# Patient Record
Sex: Female | Born: 1940 | Race: White | Hispanic: No | Smoking: Never smoker
Health system: Southern US, Community
[De-identification: ages and names within clinical notes are randomized; demographics above are authoritative.]

## PROBLEM LIST (undated history)

## (undated) DIAGNOSIS — R748 Abnormal levels of other serum enzymes: Secondary | ICD-10-CM

## (undated) DIAGNOSIS — R21 Rash and other nonspecific skin eruption: Secondary | ICD-10-CM

## (undated) DIAGNOSIS — I10 Essential (primary) hypertension: Secondary | ICD-10-CM

## (undated) DIAGNOSIS — N200 Calculus of kidney: Secondary | ICD-10-CM

## (undated) DIAGNOSIS — C50919 Malignant neoplasm of unspecified site of unspecified female breast: Secondary | ICD-10-CM

## (undated) HISTORY — DX: Malignant neoplasm of unspecified site of unspecified female breast: C50.919

## (undated) HISTORY — DX: Abnormal levels of other serum enzymes: R74.8

## (undated) HISTORY — DX: Calculus of kidney: N20.0

## (undated) HISTORY — DX: Rash and other nonspecific skin eruption: R21

## (undated) HISTORY — DX: Essential (primary) hypertension: I10

## (undated) HISTORY — PX: TONSILECTOMY, ADENOIDECTOMY, BILATERAL MYRINGOTOMY AND TUBES: SHX2538

---

## 1999-03-22 ENCOUNTER — Other Ambulatory Visit: Admission: RE | Admit: 1999-03-22 | Discharge: 1999-03-22 | Payer: Self-pay | Admitting: *Deleted

## 2002-01-28 ENCOUNTER — Other Ambulatory Visit: Admission: RE | Admit: 2002-01-28 | Discharge: 2002-01-28 | Payer: Self-pay | Admitting: *Deleted

## 2002-12-02 ENCOUNTER — Encounter: Payer: Self-pay | Admitting: Urology

## 2002-12-02 ENCOUNTER — Inpatient Hospital Stay (HOSPITAL_COMMUNITY): Admission: EM | Admit: 2002-12-02 | Discharge: 2002-12-04 | Payer: Self-pay | Admitting: Emergency Medicine

## 2002-12-02 ENCOUNTER — Encounter: Payer: Self-pay | Admitting: Emergency Medicine

## 2002-12-03 ENCOUNTER — Encounter: Payer: Self-pay | Admitting: Urology

## 2002-12-22 ENCOUNTER — Observation Stay (HOSPITAL_COMMUNITY): Admission: RE | Admit: 2002-12-22 | Discharge: 2002-12-23 | Payer: Self-pay | Admitting: Urology

## 2003-04-06 ENCOUNTER — Ambulatory Visit (HOSPITAL_COMMUNITY): Admission: RE | Admit: 2003-04-06 | Discharge: 2003-04-06 | Payer: Self-pay | Admitting: Family Medicine

## 2003-04-06 ENCOUNTER — Encounter: Payer: Self-pay | Admitting: Family Medicine

## 2008-06-26 DIAGNOSIS — C50919 Malignant neoplasm of unspecified site of unspecified female breast: Secondary | ICD-10-CM

## 2008-06-26 HISTORY — DX: Malignant neoplasm of unspecified site of unspecified female breast: C50.919

## 2008-11-19 ENCOUNTER — Inpatient Hospital Stay (HOSPITAL_COMMUNITY): Admission: EM | Admit: 2008-11-19 | Discharge: 2008-11-20 | Payer: Self-pay | Admitting: Emergency Medicine

## 2008-11-19 ENCOUNTER — Ambulatory Visit: Payer: Self-pay | Admitting: Diagnostic Radiology

## 2008-11-19 ENCOUNTER — Encounter: Payer: Self-pay | Admitting: Emergency Medicine

## 2008-11-19 ENCOUNTER — Ambulatory Visit: Payer: Self-pay | Admitting: *Deleted

## 2008-12-09 ENCOUNTER — Encounter: Payer: Self-pay | Admitting: Cardiology

## 2008-12-22 ENCOUNTER — Encounter: Admission: RE | Admit: 2008-12-22 | Discharge: 2008-12-22 | Payer: Self-pay | Admitting: General Surgery

## 2009-01-12 ENCOUNTER — Encounter (INDEPENDENT_AMBULATORY_CARE_PROVIDER_SITE_OTHER): Payer: Self-pay | Admitting: General Surgery

## 2009-01-12 ENCOUNTER — Ambulatory Visit (HOSPITAL_COMMUNITY): Admission: RE | Admit: 2009-01-12 | Discharge: 2009-01-12 | Payer: Self-pay | Admitting: General Surgery

## 2009-02-04 ENCOUNTER — Ambulatory Visit: Payer: Self-pay | Admitting: Oncology

## 2009-02-12 ENCOUNTER — Ambulatory Visit: Admission: RE | Admit: 2009-02-12 | Discharge: 2009-03-25 | Payer: Self-pay | Admitting: Radiation Oncology

## 2009-02-15 LAB — CBC WITH DIFFERENTIAL/PLATELET
BASO%: 0.6 % (ref 0.0–2.0)
LYMPH%: 17.8 % (ref 14.0–49.7)
MCHC: 34.3 g/dL (ref 31.5–36.0)
MONO#: 0.5 10*3/uL (ref 0.1–0.9)
NEUT#: 6.3 10*3/uL (ref 1.5–6.5)
Platelets: 161 10*3/uL (ref 145–400)
RBC: 4.39 10*6/uL (ref 3.70–5.45)
RDW: 13.7 % (ref 11.2–14.5)
WBC: 8.4 10*3/uL (ref 3.9–10.3)
lymph#: 1.5 10*3/uL (ref 0.9–3.3)

## 2009-02-15 LAB — COMPREHENSIVE METABOLIC PANEL
ALT: 19 U/L (ref 0–35)
Albumin: 3.9 g/dL (ref 3.5–5.2)
CO2: 25 mEq/L (ref 19–32)
Potassium: 3.6 mEq/L (ref 3.5–5.3)
Sodium: 144 mEq/L (ref 135–145)
Total Bilirubin: 0.6 mg/dL (ref 0.3–1.2)
Total Protein: 6.3 g/dL (ref 6.0–8.3)

## 2009-02-26 ENCOUNTER — Ambulatory Visit (HOSPITAL_COMMUNITY): Admission: RE | Admit: 2009-02-26 | Discharge: 2009-02-26 | Payer: Self-pay | Admitting: Oncology

## 2009-02-26 LAB — CBC WITH DIFFERENTIAL/PLATELET
BASO%: 0.6 % (ref 0.0–2.0)
Basophils Absolute: 0.1 10*3/uL (ref 0.0–0.1)
HCT: 40.9 % (ref 34.8–46.6)
HGB: 14.4 g/dL (ref 11.6–15.9)
MONO#: 0.6 10*3/uL (ref 0.1–0.9)
NEUT%: 67.7 % (ref 38.4–76.8)
RDW: 13.3 % (ref 11.2–14.5)
WBC: 7.8 10*3/uL (ref 3.9–10.3)
lymph#: 1.8 10*3/uL (ref 0.9–3.3)

## 2009-03-04 LAB — URINALYSIS, MICROSCOPIC - CHCC
Bilirubin (Urine): NEGATIVE
Ketones: NEGATIVE mg/dL
Protein: <30 mg/dL
Specific Gravity, Urine: 1.02 (ref 1.003–1.035)
pH: 6 (ref 4.6–8.0)

## 2009-03-05 LAB — URINE CULTURE

## 2009-03-10 ENCOUNTER — Ambulatory Visit: Payer: Self-pay | Admitting: Oncology

## 2009-03-12 LAB — COMPREHENSIVE METABOLIC PANEL
ALT: 16 U/L (ref 0–35)
AST: 17 U/L (ref 0–37)
CO2: 28 mEq/L (ref 19–32)
Creatinine, Ser: 0.72 mg/dL (ref 0.40–1.20)
Sodium: 141 mEq/L (ref 135–145)
Total Bilirubin: 0.4 mg/dL (ref 0.3–1.2)
Total Protein: 6.3 g/dL (ref 6.0–8.3)

## 2009-03-12 LAB — CBC WITH DIFFERENTIAL/PLATELET
BASO%: 1.2 % (ref 0.0–2.0)
EOS%: 0.6 % (ref 0.0–7.0)
LYMPH%: 17.9 % (ref 14.0–49.7)
MCHC: 34.7 g/dL (ref 31.5–36.0)
MCV: 91.2 fL (ref 79.5–101.0)
MONO%: 14.3 % — ABNORMAL HIGH (ref 0.0–14.0)
Platelets: 222 10*3/uL (ref 145–400)
RBC: 3.88 10*6/uL (ref 3.70–5.45)
RDW: 13.4 % (ref 11.2–14.5)

## 2009-03-26 LAB — CBC WITH DIFFERENTIAL/PLATELET
Eosinophils Absolute: 0.1 10*3/uL (ref 0.0–0.5)
HCT: 33 % — ABNORMAL LOW (ref 34.8–46.6)
LYMPH%: 8.9 % — ABNORMAL LOW (ref 14.0–49.7)
MCHC: 35.2 g/dL (ref 31.5–36.0)
MCV: 90.7 fL (ref 79.5–101.0)
MONO#: 1.2 10*3/uL — ABNORMAL HIGH (ref 0.1–0.9)
MONO%: 12.5 % (ref 0.0–14.0)
NEUT#: 7.4 10*3/uL — ABNORMAL HIGH (ref 1.5–6.5)
NEUT%: 76.6 % (ref 38.4–76.8)
Platelets: 158 10*3/uL (ref 145–400)
RBC: 3.64 10*6/uL — ABNORMAL LOW (ref 3.70–5.45)
WBC: 9.7 10*3/uL (ref 3.9–10.3)

## 2009-03-26 LAB — COMPREHENSIVE METABOLIC PANEL
BUN: 10 mg/dL (ref 6–23)
CO2: 29 mEq/L (ref 19–32)
Calcium: 8.7 mg/dL (ref 8.4–10.5)
Chloride: 100 mEq/L (ref 96–112)
Creatinine, Ser: 0.83 mg/dL (ref 0.40–1.20)
Total Bilirubin: 0.5 mg/dL (ref 0.3–1.2)

## 2009-04-02 LAB — BASIC METABOLIC PANEL
CO2: 23 mEq/L (ref 19–32)
Chloride: 105 mEq/L (ref 96–112)
Glucose, Bld: 137 mg/dL — ABNORMAL HIGH (ref 70–99)
Potassium: 4 mEq/L (ref 3.5–5.3)
Sodium: 138 mEq/L (ref 135–145)

## 2009-04-07 ENCOUNTER — Ambulatory Visit: Payer: Self-pay | Admitting: Oncology

## 2009-04-09 LAB — COMPREHENSIVE METABOLIC PANEL
ALT: 14 U/L (ref 0–35)
AST: 21 U/L (ref 0–37)
CO2: 26 mEq/L (ref 19–32)
Chloride: 107 mEq/L (ref 96–112)
Sodium: 138 mEq/L (ref 135–145)
Total Bilirubin: 0.6 mg/dL (ref 0.3–1.2)
Total Protein: 5.4 g/dL — ABNORMAL LOW (ref 6.0–8.3)

## 2009-04-09 LAB — CBC WITH DIFFERENTIAL/PLATELET
BASO%: 1 % (ref 0.0–2.0)
Eosinophils Absolute: 0.1 10*3/uL (ref 0.0–0.5)
MONO#: 1.1 10*3/uL — ABNORMAL HIGH (ref 0.1–0.9)
MONO%: 16.2 % — ABNORMAL HIGH (ref 0.0–14.0)
NEUT#: 4.7 10*3/uL (ref 1.5–6.5)
RBC: 2.85 10*6/uL — ABNORMAL LOW (ref 3.70–5.45)
RDW: 17.1 % — ABNORMAL HIGH (ref 11.2–14.5)
nRBC: 2 % — ABNORMAL HIGH (ref 0–0)

## 2009-04-22 LAB — CBC WITH DIFFERENTIAL/PLATELET
BASO%: 0.6 % (ref 0.0–2.0)
EOS%: 0.2 % (ref 0.0–7.0)
LYMPH%: 8.3 % — ABNORMAL LOW (ref 14.0–49.7)
MCH: 34.2 pg — ABNORMAL HIGH (ref 25.1–34.0)
MCHC: 34.3 g/dL (ref 31.5–36.0)
MONO#: 1 10*3/uL — ABNORMAL HIGH (ref 0.1–0.9)
MONO%: 15.5 % — ABNORMAL HIGH (ref 0.0–14.0)
NEUT%: 75.4 % (ref 38.4–76.8)
Platelets: 132 10*3/uL — ABNORMAL LOW (ref 145–400)
RBC: 2.65 10*6/uL — ABNORMAL LOW (ref 3.70–5.45)
WBC: 6.5 10*3/uL (ref 3.9–10.3)

## 2009-04-22 LAB — COMPREHENSIVE METABOLIC PANEL
BUN: 10 mg/dL (ref 6–23)
CO2: 23 mEq/L (ref 19–32)
Calcium: 8.7 mg/dL (ref 8.4–10.5)
Chloride: 103 mEq/L (ref 96–112)
Creatinine, Ser: 0.64 mg/dL (ref 0.40–1.20)
Glucose, Bld: 107 mg/dL — ABNORMAL HIGH (ref 70–99)

## 2009-04-30 LAB — CBC WITH DIFFERENTIAL/PLATELET
BASO%: 0.2 % (ref 0.0–2.0)
Basophils Absolute: 0 10*3/uL (ref 0.0–0.1)
EOS%: 0 % (ref 0.0–7.0)
HGB: 8.4 g/dL — ABNORMAL LOW (ref 11.6–15.9)
MCH: 34.5 pg — ABNORMAL HIGH (ref 25.1–34.0)
MCV: 99.8 fL (ref 79.5–101.0)
MONO%: 10.4 % (ref 0.0–14.0)
RBC: 2.43 10*6/uL — ABNORMAL LOW (ref 3.70–5.45)
RDW: 21.9 % — ABNORMAL HIGH (ref 11.2–14.5)
lymph#: 0.7 10*3/uL — ABNORMAL LOW (ref 0.9–3.3)

## 2009-04-30 LAB — COMPREHENSIVE METABOLIC PANEL
ALT: 27 U/L (ref 0–35)
AST: 40 U/L — ABNORMAL HIGH (ref 0–37)
Albumin: 3.2 g/dL — ABNORMAL LOW (ref 3.5–5.2)
Alkaline Phosphatase: 129 U/L — ABNORMAL HIGH (ref 39–117)
BUN: 5 mg/dL — ABNORMAL LOW (ref 6–23)
Calcium: 8.8 mg/dL (ref 8.4–10.5)
Chloride: 102 mEq/L (ref 96–112)
Potassium: 3.1 mEq/L — ABNORMAL LOW (ref 3.5–5.3)
Sodium: 136 mEq/L (ref 135–145)
Total Protein: 5.9 g/dL — ABNORMAL LOW (ref 6.0–8.3)

## 2009-04-30 LAB — URINALYSIS, MICROSCOPIC - CHCC
Ketones: NEGATIVE mg/dL
Nitrite: NEGATIVE
Protein: 100 mg/dL
pH: 6 (ref 4.6–8.0)

## 2009-05-05 ENCOUNTER — Ambulatory Visit: Payer: Self-pay | Admitting: Oncology

## 2009-05-07 LAB — CBC WITH DIFFERENTIAL/PLATELET
BASO%: 1.2 % (ref 0.0–2.0)
Basophils Absolute: 0.1 10*3/uL (ref 0.0–0.1)
EOS%: 0.1 % (ref 0.0–7.0)
Eosinophils Absolute: 0 10*3/uL (ref 0.0–0.5)
HCT: 30 % — ABNORMAL LOW (ref 34.8–46.6)
HGB: 9.8 g/dL — ABNORMAL LOW (ref 11.6–15.9)
LYMPH%: 10.4 % — ABNORMAL LOW (ref 14.0–49.7)
MCH: 33.3 pg (ref 25.1–34.0)
MCHC: 32.7 g/dL (ref 31.5–36.0)
MCV: 102 fL — ABNORMAL HIGH (ref 79.5–101.0)
MONO#: 0.9 10*3/uL (ref 0.1–0.9)
MONO%: 9.9 % (ref 0.0–14.0)
NEUT#: 7.2 10*3/uL — ABNORMAL HIGH (ref 1.5–6.5)
NEUT%: 78.4 % — ABNORMAL HIGH (ref 38.4–76.8)
Platelets: 144 10*3/uL — ABNORMAL LOW (ref 145–400)
RBC: 2.94 10*6/uL — ABNORMAL LOW (ref 3.70–5.45)
RDW: 21.8 % — ABNORMAL HIGH (ref 11.2–14.5)
WBC: 9.2 10*3/uL (ref 3.9–10.3)
lymph#: 1 10*3/uL (ref 0.9–3.3)

## 2009-05-07 LAB — COMPREHENSIVE METABOLIC PANEL
AST: 27 U/L (ref 0–37)
Albumin: 3.9 g/dL (ref 3.5–5.2)
Alkaline Phosphatase: 102 U/L (ref 39–117)
BUN: 10 mg/dL (ref 6–23)
Calcium: 8.3 mg/dL — ABNORMAL LOW (ref 8.4–10.5)
Chloride: 104 mEq/L (ref 96–112)
Potassium: 3.3 mEq/L — ABNORMAL LOW (ref 3.5–5.3)
Sodium: 142 mEq/L (ref 135–145)
Total Protein: 5.7 g/dL — ABNORMAL LOW (ref 6.0–8.3)

## 2009-05-21 LAB — CBC WITH DIFFERENTIAL/PLATELET
BASO%: 0.4 % (ref 0.0–2.0)
Basophils Absolute: 0 10*3/uL (ref 0.0–0.1)
EOS%: 0.8 % (ref 0.0–7.0)
HGB: 9.7 g/dL — ABNORMAL LOW (ref 11.6–15.9)
MCH: 33.7 pg (ref 25.1–34.0)
MCHC: 32.3 g/dL (ref 31.5–36.0)
MCV: 104.2 fL — ABNORMAL HIGH (ref 79.5–101.0)
MONO%: 6.8 % (ref 0.0–14.0)
RBC: 2.88 10*6/uL — ABNORMAL LOW (ref 3.70–5.45)
RDW: 18.7 % — ABNORMAL HIGH (ref 11.2–14.5)
lymph#: 0.8 10*3/uL — ABNORMAL LOW (ref 0.9–3.3)

## 2009-05-21 LAB — COMPREHENSIVE METABOLIC PANEL
Albumin: 3.8 g/dL (ref 3.5–5.2)
Alkaline Phosphatase: 96 U/L (ref 39–117)
CO2: 25 mEq/L (ref 19–32)
Glucose, Bld: 116 mg/dL — ABNORMAL HIGH (ref 70–99)
Potassium: 3.6 mEq/L (ref 3.5–5.3)
Sodium: 140 mEq/L (ref 135–145)
Total Protein: 5.6 g/dL — ABNORMAL LOW (ref 6.0–8.3)

## 2009-05-21 LAB — URINALYSIS, MICROSCOPIC - CHCC
Bilirubin (Urine): NEGATIVE
Glucose: NEGATIVE g/dL
Ketones: NEGATIVE mg/dL
Nitrite: NEGATIVE

## 2009-06-01 ENCOUNTER — Ambulatory Visit: Admission: RE | Admit: 2009-06-01 | Discharge: 2009-06-25 | Payer: Self-pay | Admitting: Radiation Oncology

## 2009-06-04 ENCOUNTER — Ambulatory Visit: Payer: Self-pay | Admitting: Oncology

## 2009-06-04 LAB — CBC WITH DIFFERENTIAL/PLATELET
BASO%: 0.5 % (ref 0.0–2.0)
HCT: 30 % — ABNORMAL LOW (ref 34.8–46.6)
MCHC: 32.3 g/dL (ref 31.5–36.0)
MONO#: 0.4 10*3/uL (ref 0.1–0.9)
RBC: 2.88 10*6/uL — ABNORMAL LOW (ref 3.70–5.45)
RDW: 17 % — ABNORMAL HIGH (ref 11.2–14.5)
WBC: 6.1 10*3/uL (ref 3.9–10.3)
lymph#: 0.7 10*3/uL — ABNORMAL LOW (ref 0.9–3.3)
nRBC: 0 % (ref 0–0)

## 2009-06-04 LAB — COMPREHENSIVE METABOLIC PANEL
ALT: 19 U/L (ref 0–35)
AST: 22 U/L (ref 0–37)
Albumin: 3.3 g/dL — ABNORMAL LOW (ref 3.5–5.2)
Alkaline Phosphatase: 100 U/L (ref 39–117)
Calcium: 8.7 mg/dL (ref 8.4–10.5)
Chloride: 106 mEq/L (ref 96–112)
Potassium: 3.9 mEq/L (ref 3.5–5.3)
Sodium: 139 mEq/L (ref 135–145)

## 2009-06-21 LAB — CBC WITH DIFFERENTIAL/PLATELET
BASO%: 0.4 % (ref 0.0–2.0)
EOS%: 0.5 % (ref 0.0–7.0)
HCT: 33 % — ABNORMAL LOW (ref 34.8–46.6)
MCH: 35.2 pg — ABNORMAL HIGH (ref 25.1–34.0)
MCHC: 33.8 g/dL (ref 31.5–36.0)
MCV: 104.3 fL — ABNORMAL HIGH (ref 79.5–101.0)
MONO%: 10.5 % (ref 0.0–14.0)
NEUT%: 72.2 % (ref 38.4–76.8)
RDW: 16.9 % — ABNORMAL HIGH (ref 11.2–14.5)
lymph#: 0.9 10*3/uL (ref 0.9–3.3)

## 2009-06-23 ENCOUNTER — Ambulatory Visit (HOSPITAL_COMMUNITY): Admission: RE | Admit: 2009-06-23 | Discharge: 2009-06-23 | Payer: Self-pay | Admitting: Oncology

## 2009-06-27 ENCOUNTER — Ambulatory Visit: Admission: RE | Admit: 2009-06-27 | Discharge: 2009-08-25 | Payer: Self-pay | Admitting: Radiation Oncology

## 2009-07-20 ENCOUNTER — Ambulatory Visit: Payer: Self-pay | Admitting: Oncology

## 2009-07-23 LAB — COMPREHENSIVE METABOLIC PANEL
AST: 19 U/L (ref 0–37)
Albumin: 4.1 g/dL (ref 3.5–5.2)
BUN: 14 mg/dL (ref 6–23)
CO2: 24 mEq/L (ref 19–32)
Calcium: 9 mg/dL (ref 8.4–10.5)
Chloride: 107 mEq/L (ref 96–112)
Creatinine, Ser: 0.71 mg/dL (ref 0.40–1.20)
Glucose, Bld: 128 mg/dL — ABNORMAL HIGH (ref 70–99)
Potassium: 3.8 mEq/L (ref 3.5–5.3)

## 2009-07-23 LAB — CBC WITH DIFFERENTIAL/PLATELET
Basophils Absolute: 0 10*3/uL (ref 0.0–0.1)
EOS%: 3 % (ref 0.0–7.0)
Eosinophils Absolute: 0.1 10*3/uL (ref 0.0–0.5)
HCT: 39.2 % (ref 34.8–46.6)
HGB: 13.4 g/dL (ref 11.6–15.9)
MCH: 33.5 pg (ref 25.1–34.0)
NEUT#: 3 10*3/uL (ref 1.5–6.5)
NEUT%: 71.3 % (ref 38.4–76.8)
RDW: 13.5 % (ref 11.2–14.5)
lymph#: 0.7 10*3/uL — ABNORMAL LOW (ref 0.9–3.3)

## 2009-08-31 ENCOUNTER — Ambulatory Visit: Payer: Self-pay | Admitting: Oncology

## 2009-09-03 LAB — CBC WITH DIFFERENTIAL/PLATELET
BASO%: 0.6 % (ref 0.0–2.0)
EOS%: 1.5 % (ref 0.0–7.0)
MCH: 33.3 pg (ref 25.1–34.0)
MCHC: 35.2 g/dL (ref 31.5–36.0)
NEUT%: 70.8 % (ref 38.4–76.8)
RBC: 3.92 10*6/uL (ref 3.70–5.45)
RDW: 13 % (ref 11.2–14.5)
lymph#: 0.8 10*3/uL — ABNORMAL LOW (ref 0.9–3.3)

## 2009-09-03 LAB — COMPREHENSIVE METABOLIC PANEL
ALT: 16 U/L (ref 0–35)
AST: 15 U/L (ref 0–37)
Calcium: 9.1 mg/dL (ref 8.4–10.5)
Chloride: 107 mEq/L (ref 96–112)
Creatinine, Ser: 0.76 mg/dL (ref 0.40–1.20)
Potassium: 3.9 mEq/L (ref 3.5–5.3)
Sodium: 141 mEq/L (ref 135–145)

## 2009-12-07 ENCOUNTER — Other Ambulatory Visit: Admission: RE | Admit: 2009-12-07 | Discharge: 2009-12-07 | Payer: Self-pay | Admitting: Family Medicine

## 2009-12-20 ENCOUNTER — Ambulatory Visit: Payer: Self-pay | Admitting: Oncology

## 2009-12-22 LAB — COMPREHENSIVE METABOLIC PANEL
ALT: 15 U/L (ref 0–35)
AST: 18 U/L (ref 0–37)
Albumin: 4.1 g/dL (ref 3.5–5.2)
CO2: 22 mEq/L (ref 19–32)
Calcium: 9.5 mg/dL (ref 8.4–10.5)
Chloride: 108 mEq/L (ref 96–112)
Potassium: 4.5 mEq/L (ref 3.5–5.3)
Total Protein: 6.5 g/dL (ref 6.0–8.3)

## 2009-12-22 LAB — CBC WITH DIFFERENTIAL/PLATELET
BASO%: 0.7 % (ref 0.0–2.0)
EOS%: 1.7 % (ref 0.0–7.0)
HGB: 13.3 g/dL (ref 11.6–15.9)
MCH: 33.4 pg (ref 25.1–34.0)
MCHC: 34.4 g/dL (ref 31.5–36.0)
MONO#: 0.6 10*3/uL (ref 0.1–0.9)
RDW: 13.9 % (ref 11.2–14.5)
WBC: 5.5 10*3/uL (ref 3.9–10.3)
lymph#: 1 10*3/uL (ref 0.9–3.3)

## 2010-03-22 IMAGING — RF IR FLUORO GUIDE CV LINE*R*
1 series · 1 of 1 positions shown · non-contrast
Comparison: none

CLINICAL DATA: 67-year-old with breast cancer.

[Series 1: run · 1 of 1 slices shown]
[im 1/1]
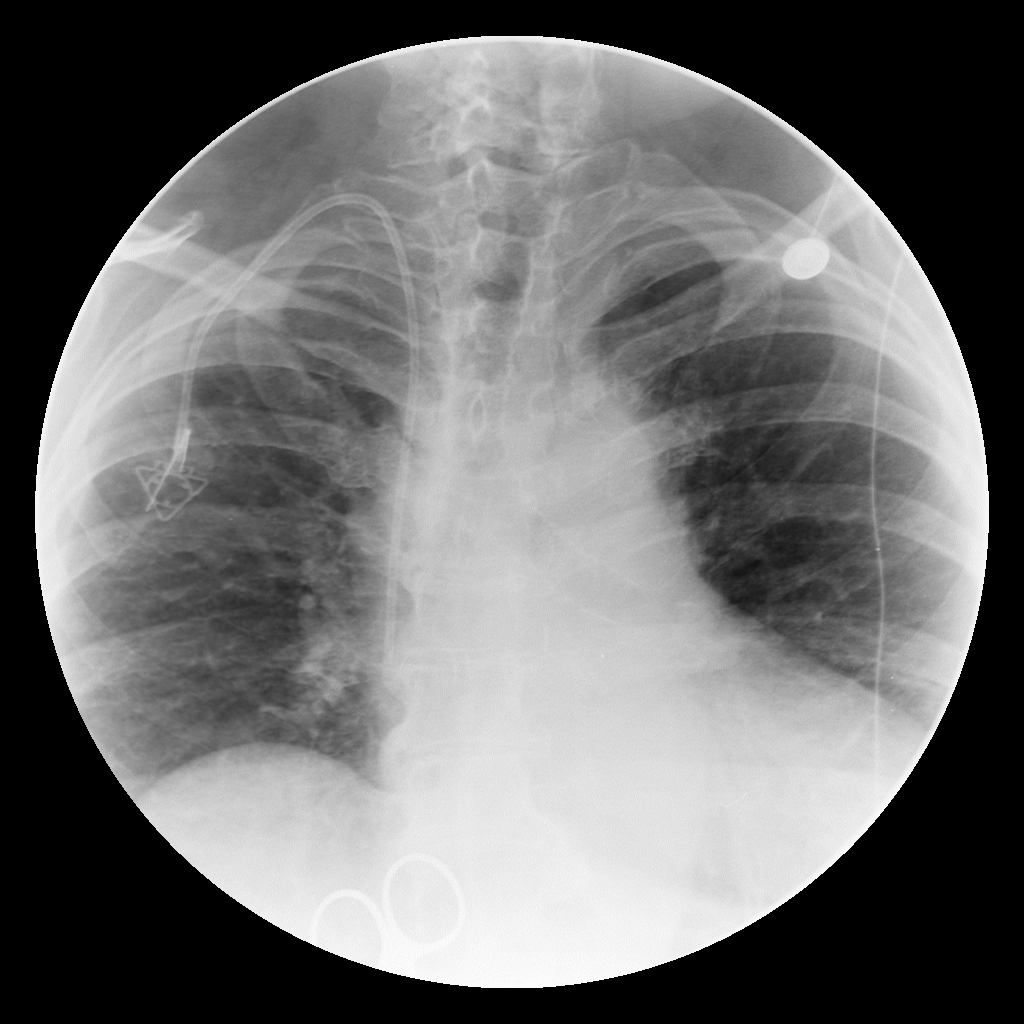

[1 of 1 positions shown; findings below may reference images not displayed]

FLUOROSCOPIC AND ULTRASOUND GUIDED PLACEMENT OF A SUBCUTANEOUS
PORT.

Medications:Ancef 1 gm was given in an appropriate time frame for
this procedure. Zofran 4 mg, Versed 4 mg and Fentanyl 100 mcg

Moderate sedation time:50 minutes. A radiology nurse monitored the
patient for moderate sedation.

Fluoroscopy time: 0.4 minutes

Procedure:  The risks of the procedure were explained to the
patient.  Informed consent was obtained.  Patient was placed supine
on the interventional table.  Ultrasound confirmed a patent right
internal jugular vein.  The right chest and neck were cleaned with
a skin antiseptic and a sterile drape was placed.  Maximal barrier
sterile technique was utilized including caps, mask, sterile gowns,
sterile gloves, sterile drape, hand hygiene and skin antiseptic.
The right neck was anesthetized with 1% lidocaine.  Small incision
was made in the right neck with a blade.  Micropuncture set was
placed in the right IJ with ultrasound guidance.  The micropuncture
wire was used for measurement purposes.  The right chest was
anesthetized with 1% lidocaine with epinephrine.  #15 blade was
used to make an incision and a subcutaneous port pocket was formed.
8 french Power Port was assembled.  Subcutaneous tunnel was formed
with a stiff tunneling device.  The port catheter was brought
through the subcutaneous tunnel.  The port was secured to the chest
wall.  The micropuncture set was exchanged for a peel-away sheath.
The catheter was placed through the peel-away sheath and the tip
was positioned at the cavoatrial junction.  Catheter placement was
confirmed with fluoroscopy.  The port was accessed and flushed with
heparinized saline.  The port pocket was closed using two layers of
absorbable sutures and Dermabond.  The vein dermatomy site was
closed using a single layer of absorbable suture and Dermabond.
Sterile dressings were applied.  Patient tolerated the procedure
well without an immediate complication.  Ultrasound and
fluoroscopic images were taken and saved for this procedure.
IMPRESSION: Placement of a subcutaneous port device.  The tip of
the catheter is at the cavoatrial junction and ready to be used.

## 2010-03-29 ENCOUNTER — Ambulatory Visit: Payer: Self-pay | Admitting: Oncology

## 2010-04-21 LAB — COMPREHENSIVE METABOLIC PANEL
ALT: 17 U/L (ref 0–35)
AST: 17 U/L (ref 0–37)
Albumin: 4 g/dL (ref 3.5–5.2)
BUN: 12 mg/dL (ref 6–23)
CO2: 28 mEq/L (ref 19–32)
Calcium: 9.2 mg/dL (ref 8.4–10.5)
Chloride: 103 mEq/L (ref 96–112)
Creatinine, Ser: 0.78 mg/dL (ref 0.40–1.20)
Potassium: 4.4 mEq/L (ref 3.5–5.3)

## 2010-04-21 LAB — CBC WITH DIFFERENTIAL/PLATELET
Basophils Absolute: 0 10*3/uL (ref 0.0–0.1)
EOS%: 1 % (ref 0.0–7.0)
Eosinophils Absolute: 0.1 10*3/uL (ref 0.0–0.5)
HCT: 40 % (ref 34.8–46.6)
HGB: 13.5 g/dL (ref 11.6–15.9)
MCH: 33 pg (ref 25.1–34.0)
MONO#: 0.4 10*3/uL (ref 0.1–0.9)
NEUT#: 4 10*3/uL (ref 1.5–6.5)
NEUT%: 74.4 % (ref 38.4–76.8)
RDW: 13 % (ref 11.2–14.5)
WBC: 5.4 10*3/uL (ref 3.9–10.3)
lymph#: 0.9 10*3/uL (ref 0.9–3.3)

## 2010-07-17 ENCOUNTER — Encounter: Payer: Self-pay | Admitting: Oncology

## 2010-08-18 ENCOUNTER — Encounter (HOSPITAL_BASED_OUTPATIENT_CLINIC_OR_DEPARTMENT_OTHER): Payer: PRIVATE HEALTH INSURANCE | Admitting: Oncology

## 2010-08-18 ENCOUNTER — Other Ambulatory Visit: Payer: Self-pay | Admitting: Oncology

## 2010-08-18 DIAGNOSIS — C50219 Malignant neoplasm of upper-inner quadrant of unspecified female breast: Secondary | ICD-10-CM

## 2010-08-18 DIAGNOSIS — R209 Unspecified disturbances of skin sensation: Secondary | ICD-10-CM

## 2010-08-18 DIAGNOSIS — R609 Edema, unspecified: Secondary | ICD-10-CM

## 2010-08-18 DIAGNOSIS — I10 Essential (primary) hypertension: Secondary | ICD-10-CM

## 2010-08-18 LAB — CBC WITH DIFFERENTIAL/PLATELET
BASO%: 0.5 % (ref 0.0–2.0)
EOS%: 1.1 % (ref 0.0–7.0)
HCT: 38.9 % (ref 34.8–46.6)
LYMPH%: 17.9 % (ref 14.0–49.7)
MCH: 33.6 pg (ref 25.1–34.0)
MCHC: 34.9 g/dL (ref 31.5–36.0)
NEUT%: 71.3 % (ref 38.4–76.8)
lymph#: 1 10*3/uL (ref 0.9–3.3)

## 2010-08-18 LAB — COMPREHENSIVE METABOLIC PANEL
ALT: 15 U/L (ref 0–35)
AST: 18 U/L (ref 0–37)
Alkaline Phosphatase: 112 U/L (ref 39–117)
Creatinine, Ser: 0.85 mg/dL (ref 0.40–1.20)
Total Bilirubin: 0.7 mg/dL (ref 0.3–1.2)

## 2010-10-02 LAB — COMPREHENSIVE METABOLIC PANEL
AST: 23 U/L (ref 0–37)
CO2: 32 mEq/L (ref 19–32)
Calcium: 9.2 mg/dL (ref 8.4–10.5)
Creatinine, Ser: 0.89 mg/dL (ref 0.4–1.2)
GFR calc Af Amer: >60 mL/min (ref >60)
GFR calc non Af Amer: >60 mL/min (ref >60)
Glucose, Bld: 102 mg/dL — ABNORMAL HIGH (ref 70–99)

## 2010-10-02 LAB — DIFFERENTIAL
Lymphocytes Relative: 27 % (ref 12–46)
Lymphs Abs: 2.3 10*3/uL (ref 0.7–4.0)
Neutro Abs: 5.4 10*3/uL (ref 1.7–7.7)
Neutrophils Relative %: 64 % (ref 43–77)

## 2010-10-02 LAB — CBC
MCHC: 34.9 g/dL (ref 30.0–36.0)
MCV: 93.4 fL (ref 78.0–100.0)
RBC: 4.43 MIL/uL (ref 3.87–5.11)

## 2010-10-04 LAB — COMPREHENSIVE METABOLIC PANEL
ALT: 18 U/L (ref 0–35)
AST: 23 U/L (ref 0–37)
BUN: 10 mg/dL (ref 6–23)
CO2: 26 mEq/L (ref 19–32)
Chloride: 103 mEq/L (ref 96–112)
Creatinine, Ser: 0.81 mg/dL (ref 0.4–1.2)
Glucose, Bld: 106 mg/dL — ABNORMAL HIGH (ref 70–99)
Potassium: 3.7 mEq/L (ref 3.5–5.1)

## 2010-10-04 LAB — LIPID PANEL
HDL: 36 mg/dL — ABNORMAL LOW (ref >39)
LDL Cholesterol: 92 mg/dL (ref 0–99)
Total CHOL/HDL Ratio: 4.2 RATIO
Triglycerides: 113 mg/dL (ref <150)
VLDL: 23 mg/dL (ref 0–40)

## 2010-10-04 LAB — URINALYSIS, ROUTINE W REFLEX MICROSCOPIC
Glucose, UA: NEGATIVE mg/dL
Nitrite: NEGATIVE
Protein, ur: NEGATIVE mg/dL
pH: 5.5 (ref 5.0–8.0)

## 2010-10-04 LAB — CARDIAC PANEL(CRET KIN+CKTOT+MB+TROPI)
CK, MB: 1.9 ng/mL (ref 0.3–4.0)
Troponin I: 0.01 ng/mL (ref 0.00–0.06)
Troponin I: 0.01 ng/mL (ref 0.00–0.06)

## 2010-10-04 LAB — DIFFERENTIAL
Basophils Relative: 1 % (ref 0–1)
Eosinophils Absolute: 0.2 10*3/uL (ref 0.0–0.7)
Lymphs Abs: 2.5 10*3/uL (ref 0.7–4.0)
Monocytes Absolute: 0.8 10*3/uL (ref 0.1–1.0)
Monocytes Relative: 9 % (ref 3–12)

## 2010-10-04 LAB — POCT CARDIAC MARKERS
CKMB, poc: <1 ng/mL — ABNORMAL LOW (ref 1.0–8.0)
Troponin i, poc: <0.05 ng/mL (ref 0.00–0.09)
Troponin i, poc: <0.05 ng/mL (ref 0.00–0.09)

## 2010-10-04 LAB — CBC
HCT: 38.3 % (ref 36.0–46.0)
Hemoglobin: 13.1 g/dL (ref 12.0–15.0)
Hemoglobin: 14.1 g/dL (ref 12.0–15.0)
MCHC: 33.2 g/dL (ref 30.0–36.0)
MCV: 93.5 fL (ref 78.0–100.0)
RBC: 4.05 MIL/uL (ref 3.87–5.11)
RBC: 4.54 MIL/uL (ref 3.87–5.11)
RDW: 13.1 % (ref 11.5–15.5)

## 2010-10-04 LAB — BASIC METABOLIC PANEL
CO2: 32 mEq/L (ref 19–32)
Chloride: 105 mEq/L (ref 96–112)
GFR calc Af Amer: >60 mL/min (ref >60)
Potassium: 4.1 mEq/L (ref 3.5–5.1)
Sodium: 146 mEq/L — ABNORMAL HIGH (ref 135–145)

## 2010-10-04 LAB — PROTIME-INR: INR: 1 (ref 0.00–1.49)

## 2010-10-04 LAB — POCT B-TYPE NATRIURETIC PEPTIDE (BNP): B Natriuretic Peptide, POC: 18.2 pg/mL (ref 0–100)

## 2010-11-08 NOTE — Op Note (Signed)
Lindsey Brady, Lindsey Brady NO.:  [id number]   MEDICAL RECORD NO.:  [id number]           PATIENT TYPE:   LOCATION:                                 FACILITY:   PHYSICIAN:  [name]. Vernell Morgans, M.D. DATE OF BIRTH:  1940-09-28   DATE OF PROCEDURE:  01/12/2009  DATE OF DISCHARGE:                               OPERATIVE REPORT   PREOPERATIVE DIAGNOSIS:  Left breast cancer.   POSTOPERATIVE DIAGNOSIS:  Left breast cancer.   PROCEDURE:  Left breast needle-localized lumpectomy and sentinel node  biopsy x3 with injection of blue dye.   SURGEON:  [name]. Vernell Morgans, MD   ANESTHESIA:  General endotracheal.   PROCEDURE:  After informed consent was obtained, the patient was brought  to the operating room, placed in supine position on the operating table.  After adequate induction of general anesthesia, the patient's left  breast, chest, and axillary area were all prepped with Betadine and  draped in usual sterile manner.  Earlier in the day, the patient had  undergone a wire localization procedure and the wire was entering the  left breast in the 12 o'clock position just at the edge of the areola  and headed medially.  The patient also had undergone injection of 1 mCi  of technetium sulfur colloid in the subareolar position.  At this point,  2 mL of methylene blue and 3 mL of injectable saline were also injected  in the subareolar position and the breast was massaged for about 5  minutes.  Next, NeoProbe was used to identify a hot spot in the left  axilla.  A small transverse incision was made overlying the hot spot.  This incision was carried down through the skin and subcutaneous tissue  sharply with electrocautery until the axilla was entered.  Then, a liver  retractor was deployed using the NeoProbe to guide the dissection.  Three hot lymph nodes were identified.  They all had a small amount of  blue dye in them.  Each of these was excised by a combination of sharp  Bovie  dissection and then the lymphatics were clamped with hemostats,  divided and ligated 3-0 Vicryl ties.  Ex vivo counts on these 3 lymph  nodes ranged between 50 and 100 counts.  These were all sent for touch  preps and touch preps on these nodes were negative for any cancer cells.  Hemostasis was also achieved using the Bovie electrocautery.  The deep  layer of the wound was then closed with interrupted 3-0 Vicryl stitches,  the skin was closed with running 4-0 Monocryl subcuticular stitch.  Next, attention was turned to the left breast.  A curvilinear incision  was made transversely medial to the wire entry site with a 15-blade  knife.  This incision was carried down through the skin and subcutaneous  tissue sharply with the electrocautery.  Once into the breast tissue,  the path of wire could be palpated.  A circular portion of breast tissue  was excised sharply around the path of the wire.  This was all done  sharply with the electrocautery.  Once this specimen was removed, it was  oriented according to the assigned paint colors.  Specimen radiograph  showed the clip to be in the center of the specimen.  It was then sent  to pathology for further evaluation.  Hemostasis was achieved using the  Bovie electrocautery.  The wound was irrigated copious amounts of saline  and then infiltrated with 0.25% Marcaine.  The deeper layer of the wound  was then closed with interrupted 3-0 Vicryl stitches and skin was closed  with running 4-0 Monocryl subcuticular stitch.  Dermabond dressing was applied to each incision.  The patient tolerated  the procedure well.  At the end of the case, all needle, sponge, and  instrument counts were correct.  The patient was then awakened, taken to  recovery room in stable condition.      [name]. Vernell Morgans, M.D.  Electronically Signed     PST/MEDQ  D:  01/12/2009  T:  01/13/2009  Job:  [id number]

## 2010-11-08 NOTE — H&P (Signed)
Lindsey Brady, Lindsey Brady NO.:  [id number]   MEDICAL RECORD NO.:  [id number]          PATIENT TYPE:  INP   LOCATION:  4733                         FACILITY:  [facility]   PHYSICIAN:  Audery Amel, MD    DATE OF BIRTH:  [birth year]   DATE OF ADMISSION:  11/19/2008  DATE OF DISCHARGE:                              HISTORY & PHYSICAL   CHIEF COMPLAINT:  Chest pain.   HISTORY OF THE PRESENT ILLNESS:  The patient is a 70 year old white  female with no prior history of coronary disease who is transferred from  the [facility] emergency department for further evaluation of chest  pain.  The patient states her symptoms began at approximately 4:00 P.M.  while at work this afternoon.  She was resting at the time of onset and  the symptoms lasted approximately 1.5 hours, and then resolved  spontaneously.  She states that she had a similar episode in the past  for which she underwent evaluation with a nuclear stress test, which was  interpreted as negative per her report approximately 3-4 years ago.  In  he [facility] ED an EKG was obtained, which revealed no evidence of  acute injury or ischemia.  Her cardiac biomarkers were negative by point  of care x2.   On arrival to [facility] the patient was chest pain-free.  On review of  her records the patient presented with uncontrolled hypertension with a  systolic blood pressure of 200.  On arrival [facility] to [facility] her  systolic was 190 over a diastolic 111.  She remains chest pain-free, and  otherwise is without complaints.   PAST MEDICAL HISTORY:  1. Hypertension.  2. Osteoarthritis.   MEDICATIONS:  Current home medications are none.   ALLERGIES:  No known drug allergies.   SOCIAL HISTORY:  She denies any smoking, alcohol or illicit substances.   FAMILY HISTORY:  There is no premature history of coronary artery  disease in her family.  Her father had coronary angioplasty performed in  his 39s.   REVIEW OF SYSTEMS:  The  review of systems is as per the HPI; otherwise,  a complete review of systems obtained at this time and is negative,  except as documented.  Of note, the patient states that she feels that  her functional capacity over the last several years has gradually  declined.  She does report dyspnea with heavy exertion, but states that  she is able to perform 4-6 minutes without any difficulties.   PHYSICAL EXAMINATION:  VITAL SIGNS:  Blood pressure is 190/111, pulse is  80 beats/minute and O2 sat is 96 on room air.  GENERAL APPEARANCE:  In general the patient is alert and oriented x3,  and in no acute stress and is a pleasant person.  HEENT:  Normocephalic and atraumatic head.  EOMI.  Nares patent.  Oral  mucosa, pharynx are clear without erythema or exudate.  NECK:  The patient's neck is supple with full range of motion and with  no appreciable JVD.  There is no palpable thyromegaly or  lymphadenopathy.  Carotid upstrokes are equal and symmetric bilaterally  with no audible bruits.  CHEST:  The patient's chest is clear to auscultation bilaterally.  HEART:  Cardiovascular reveals a normal S1 and S2 with a positive S4.  Her PMI is nonpalpable.  Peripheral pulses are 2+ and symmetric  bilaterally.  ABDOMEN:  The abdomen is obese, soft, nontender and nondistended.  Positive bowel sounds.  No hepatosplenomegaly.  EXTREMITIES:  The extremities reveal 2+ bilateral lower extremity edema.  There is no evidence of inflammation or ulceration.  NEUROLOGIC  EXAMINATION:  Neurologically she is grossly nonfocal.   LABORATORY DATA:  Ancillary data:  EKG by my interpretation revealed  normal sinus rhythm with left axis deviation and LVH by voltage criteria  and aVL.   Labs:  Sodium 146, potassium 4.1, chloride 105, CO2 32, BUN 15,  creatinine 0.8, and glucose 98.  White count 8.4, hemoglobin 14.1,  hematocrit 42.5 and platelet count 194,000.  BNP 18.2.  Cardiac  biomarkers by point care testing were  negative x2.   IMPRESSION:  1. Chest pain rule out myocardial infarction.  2. Uncontrolled hypertension.  3. Obesity.   PLAN:  1. We will admit the patient to a telemetry bed for further evaluation      and monitoring.  2. We will cycle cardiac biomarkers x2 additional sets to rule out      myocardial injury.  Her symptoms seem somewhat unusual for that of      a cardiac etiology, especially given that her symptoms started      approximately 4:00 P.M.  The EKG revealed no evidence of acute      injury or ischemia, and her cardiac biomarkers were negative x2      sets by point of care testing.  3. The patient, however, does have severe hypertension, which is      uncontrolled.  The patient states that she was previously      prescribed blood pressure medication several years ago; however, it      made her feel fatigued and therefore she self-discontinued the      medication.  4. If the patient rules out myocardial infarction, I do believe that a      noninvasive evaluation would certainly be a reasonable next step      for further risk stratification.  However, I do believe that the      patient needs better blood pressure control prior to undergoing      stress testing.  I have explained the importance of maintaining      normal blood pressure for preventative purposes.  I have initiated      metoprolol 25 mg by mouth twice a day as well as an ACE inhibitor      and lisinopril 20 mg daily.  Given that the patient had significant      difficulty with an antihypertensive in the past, these should be      titrated slowly to achieve the desired effect.  5. We will check her lipid profile in the A.M. and consider statin      therapy if indicated.      Audery Amel, MD  Electronically Signed     SHG/MEDQ  D:  11/20/2008  T:  11/20/2008  Job:  [id number]

## 2010-11-08 NOTE — Discharge Summary (Signed)
Lindsey Brady, Lindsey Brady                  ACCOUNT NO.:  [id number]   MEDICAL RECORD NO.:  [id number]          PATIENT TYPE:  INP   LOCATION:  4733                         FACILITY:  [facility]   PHYSICIAN:  Armanda Magic, M.D.     DATE OF BIRTH:  Feb 02, 1941   DATE OF ADMISSION:  11/19/2008  DATE OF DISCHARGE:  11/20/2008                               DISCHARGE SUMMARY   DISCHARGE DIAGNOSES:  1. Atypical chest pain, negative enzymes.  2. Hypertension.  3. Obesity.   HOSPITAL COURSE:  Lindsey Brady is a 70 year old female with no prior  history of coronary artery disease, who began having chest pain symptoms  around 4:00 p.m. on the day of admission.  The pain lasted about an hour  and a half and then spontaneously resolved.  She has had similar  episodes in the past.  About 3 or 4 years ago, she had a stress  Cardiolite, which was normal according to the patient.   The patient was watched in the hospital overnight, and troponins were  negative.  We also did a chest CT to rule out pulmonary embolism, and  this was negative.  There was a 10-mm left breast nodule and the  mammography was recommended, and this will be done as an outpatient.   OTHER LABORATORY STUDIES DURING THE PATIENT'S HOSPITAL STAY:  Cardiac  isoenzymes, which were negative.  Sodium 138, potassium 3.7, BUN 10,  creatinine 0.81.  Total cholesterol 151, triglycerides 113, HDL 36, LDL  92.  Hemoglobin 13.1, hematocrit 38.3, white count 7.6, platelets 146.  D-dimer was elevated at 1.14 and therefore, that is why we got the chest  CT.   DISCHARGE MEDICATIONS:  1. Lopressor 25 mg 1 p.o. b.i.d.  2. Lisinopril 20 mg 1 p.o. daily.   She is to remain on a low-sodium heart-healthy diet.  Activity as  tolerated.  Follow up with Dr. Mayford Knife for stress test on November 24, 2008,  at 9:45 a.m. and then follow up with Dr. Mayford Knife on December 01, 2008, at 2:30  p.m.      Guy Franco, P.A.      Armanda Magic, M.D.  Electronically Signed    LB/MEDQ  D:  01/05/2009  T:  01/05/2009  Job:  [id number]

## 2010-11-11 NOTE — H&P (Signed)
   NAME:  Lindsey Brady, Lindsey Brady                            ACCOUNT NO.:  [id number]   MEDICAL RECORD NO.:  [id number]                   PATIENT TYPE:  INP   LOCATION:  0382                                 FACILITY:  [facility]   PHYSICIAN:  Sigmund I. Patsi Sears, M.D.         DATE OF BIRTH:  1940-09-11   DATE OF ADMISSION:  12/01/2002  DATE OF DISCHARGE:  12/04/2002                                HISTORY & PHYSICAL   HISTORY OF PRESENT ILLNESS:  A 70 year old white female presented to ER on  June 7 with right flank pain and nausea and vomiting.  Initially, she was  worked up for her gallbladder.  On CT a 9 mm UPJ stone was found with  hydronephrosis.  The ER physician also diagnosed her with pyelonephritis due  to her UA at the time.   PAST MEDICAL HISTORY:  Hypertension.   PAST SURGICAL HISTORY:  None.   SOCIAL HISTORY:  The patient lives alone.  She has two sons and two  daughters.  One daughter is a Development worker, community in [place].  Denies any alcohol or  tobacco use.   MEDICATIONS:  Micardis for hypertension.   ALLERGIES:  No known drug allergies.   REVIEW OF SYSTEMS:  Positive for right flank pain, fever, nausea, and  vomiting.   PHYSICAL EXAMINATION:  GENERAL:  A 70 year old white female in mild  distress.  NECK:  Supple.  No JVD noted.  CARDIAC:  Regular rate and rhythm.  No murmur, rub, or gallop identified.  RESPIRATORY:  Clear to auscultation bilaterally.  GASTROINTESTINAL:  Soft, tender on right.  Positive CVA tenderness and  positive bowel sounds.  GENITOURINARY:  CT reveals 9 mm right UPJ stone, dark urine.  NEUROLOGIC:  Intact.  EXTREMITIES:  No edema noted.  2+ pulses bilaterally.   LABORATORIES:  X-rays:  Reveals 9 mm right UPJ stone with hydronephrosis.   ASSESSMENT:  Right UPJ stone and hydronephrosis.   PLAN:  Will take to operating room for cystoscopy, right retrograde, and  placement of double J stent.     Terri Piedra, N.P.                         Sigmund I. Patsi Sears,  M.D.    HB/MEDQ  D:  12/04/2002  T:  12/04/2002  Job:  [id number]

## 2010-11-11 NOTE — Op Note (Signed)
   NAME:  Lindsey Brady, HAUBNER                            ACCOUNT NO.:  [id number]   MEDICAL RECORD NO.:  [id number]                   PATIENT TYPE:  INP   LOCATION:  0382                                 FACILITY:  [facility]   PHYSICIAN:  Sigmund I. Patsi Sears, M.D.         DATE OF BIRTH:  1940-12-17   DATE OF PROCEDURE:  12/02/2002  DATE OF DISCHARGE:                                 OPERATIVE REPORT   PREOPERATIVE DIAGNOSIS:  Obstructing, impacted right ureteropelvic junction  stone.   POSTOPERATIVE DIAGNOSIS:  A 9 mm, obstructing right renal pelvic stone.   OPERATION:  1. Cystourethroscopy.  2. Right retrograde pyelogram with interpretation.  3. Right double-J catheter.   SURGEON:  Sigmund I. Patsi Sears, M.D.   ASSISTANT:  Terri Piedra, N.P.   PREPARATION:  After appropriate preanesthesia, the patient is brought to the  operating room and placed on the operating table in the dorsal supine  position where general LMA anesthesia was introduced.  She was then re-  placed in the dorsal lithotomy position, where the pubis was prepped with  Betadine solution and draped in the usual fashion.   REVIEW OF HISTORY:  This 70 year old female is admitted via the emergency  room at 0500 on June 8, with pyelonephritis, an impacted 9 mm right UPJ  stone.  She is now for retrograde pyelogram and double-J catheter.   DESCRIPTION OF PROCEDURE:  Cystourethroscopy was accomplished.  A right  retrograde pyelogram was performed.  The ureter appeared normal.  The renal  pelvis was a small renal pelvis and impacted right UPJ stone was manipulated  back into the upper pole calix.  A guidewire was passed into the upper pole  calix, and double-J catheter, 6 x 26 cm was passed into the renal pelvis,  and coiled in the bladder.  The patient was given IV Toradol, as well as a  B&O suppository, and awakened and taken to the recovery room in good  condition.                                               Sigmund I.  Patsi Sears, M.D.    SIT/MEDQ  D:  12/02/2002  T:  12/02/2002  Job:  [id number]

## 2010-11-11 NOTE — Discharge Summary (Signed)
   NAME:  Lindsey Brady, Lindsey Brady                            ACCOUNT NO.:  [id number]   MEDICAL RECORD NO.:  [id number]                   PATIENT TYPE:  INP   LOCATION:  0382                                 FACILITY:  [facility]   PHYSICIAN:  Sigmund I. Patsi Sears, M.D.         DATE OF BIRTH:  1940/09/08   DATE OF ADMISSION:  12/01/2002  DATE OF DISCHARGE:  12/04/2002                                 DISCHARGE SUMMARY   HISTORY OF PRESENT ILLNESS:  The patient presented to ER on June 7 with  right flank pain and nausea and vomiting.  Initially, she was worked up for  her gallbladder.  CT revealed 9 mm right UPJ stone with hydronephrosis.  She  was also diagnosed in the ER by the ER physician with pyelonephritis due to  her UA.  She was taken to the operating room on December 02, 2002 by Sigmund I.  Patsi Sears, M.D. for a cystoscopy, right retrograde, pyelogram, and  placement of a double J catheter.  This was completed successfully and  patient was kept overnight.   PAST MEDICAL HISTORY:  Hypertension.   PAST SURGICAL HISTORY:  None.   SOCIAL HISTORY:  The patient lives alone in [place].  She has two  daughters and two sons.  One daughter is a Development worker, community in [place].  She denies  any alcohol or tobacco use.   MEDICATIONS:  Micardis for hypertension.   ALLERGIES:  NKDA.   HOSPITAL COURSE:  On postoperative day number one patient spiked a fever of  102.3.  Blood cultures x2 were obtained as well as a urine culture.  Incentive spirometry was ordered to the bed side and the patient was  instructed to use q.1h. while awake.  Her antibiotics were changed to Cipro  p.o. from Ancef IV.  The patient's temperature began to decrease and on  postoperative day number two patient's temperature has decreased to 99.0.  She denies any pain or problems.  Her lungs are clear to auscultation  bilaterally and she is tolerating a regular diet.  She will be discharged  home today with seven more days of Cipro as well as  prescriptions for  Percocet and Urised.  She will follow up with [place] in our office for her  double J stent removal.   LABORATORIES:  Urine culture sensitive to Cipro.  Blood cultures were  negative.   DISCHARGE MEDICATIONS:  1. Percocet 5/325 one p.o. q.4-6h. p.r.n. pain.  2. Urised one p.o. b.i.d. as needed for burning.  3. Cipro XR one p.o. daily for seven days.   CONDITION ON DISCHARGE:  Stable.     Terri Piedra, N.P.                         Sigmund I. Patsi Sears, M.D.    HB/MEDQ  D:  12/04/2002  T:  12/04/2002  Job:  [id number]

## 2010-11-11 NOTE — Op Note (Signed)
NAME:  JAQUANDA, WICKERSHAM                            ACCOUNT NO.:  [id number]   MEDICAL RECORD NO.:  [id number]                   PATIENT TYPE:  AMB   LOCATION:  DAY                                  FACILITY:  [facility]   PHYSICIAN:  Sigmund I. Patsi Sears, M.D.         DATE OF BIRTH:  [birth year]   DATE OF PROCEDURE:  12/22/2002  DATE OF DISCHARGE:                                 OPERATIVE REPORT   PREOPERATIVE DIAGNOSIS:  Right mid ureteral stone.   POSTOPERATIVE DIAGNOSIS:  Right mid ureteral stone.   OPERATION:  1. Cystourethroscopy.  2. Right retrograde pyelogram.  3. Right ureteroscopy.  4. Laser fractionation of right mid ureteral stone and right double-J     catheter (6 French x 26 cm).   SURGEON:  Sigmund I. Patsi Sears, M.D.   FIRST ASSISTANT:  [facility]   PREPARATION:  After appropriate preanesthesia, the patient is brought to the  operating room and placed on the operating table in dorsal supine position  where general LMA anesthesia was introduced.  She was then re-placed in  dorsal lithotomy position where the pubis was prepped with Betadine solution  and draped in the usual fashion.   DESCRIPTION OF PROCEDURE:  Cystoscopy revealed the right double-J catheter  in place, and this double-J catheter was extracted outside of the urethra.  A floppy-tip guidewire .038 in size was then passed, and it coiled in the  renal pelvis.  There was a guidewire and a safety wire.  The double-J  catheter was removed.  The ureteroscope was passed without difficulty to the  level of the stone, and the stone was identified, and a basket was placed  around the stone.  However, the stone could not be advanced.  It appeared to  be impacted in the right mid ureter.  The stone was therefore manipulated so  that the basket could be extracted and once this was accomplished, the laser  fiber was placed through the ureteroscope and using the laser, the stone was  fractionated.  Basket  extraction was then accomplished of multiple fragments  of stone.  Bleeding of the ureter was noted, and it was felt that the ureter  had been traumatized by the multiple extractions and laser fractionation of  the stone as well as the impaction of the stone.  It was elected to  therefore place a 6 x 26 double-J catheter, and this was accomplished  without difficulty with good coil in the renal pelvis by fluoroscopic  control.  The patient was given IV Toradol and awakened and taken to the  recovery room in good condition.                                               Sigmund I. Patsi Sears, M.D.  SIT/MEDQ  D:  12/22/2002  T:  12/22/2002  Job:  [id number]

## 2010-12-21 ENCOUNTER — Other Ambulatory Visit: Payer: Self-pay | Admitting: Oncology

## 2010-12-21 ENCOUNTER — Encounter (HOSPITAL_BASED_OUTPATIENT_CLINIC_OR_DEPARTMENT_OTHER): Payer: PRIVATE HEALTH INSURANCE | Admitting: Oncology

## 2010-12-21 DIAGNOSIS — R209 Unspecified disturbances of skin sensation: Secondary | ICD-10-CM

## 2010-12-21 DIAGNOSIS — I10 Essential (primary) hypertension: Secondary | ICD-10-CM

## 2010-12-21 DIAGNOSIS — R609 Edema, unspecified: Secondary | ICD-10-CM

## 2010-12-21 DIAGNOSIS — C50219 Malignant neoplasm of upper-inner quadrant of unspecified female breast: Secondary | ICD-10-CM

## 2010-12-21 LAB — CBC WITH DIFFERENTIAL/PLATELET
BASO%: 0.5 % (ref 0.0–2.0)
HCT: 37.7 % (ref 34.8–46.6)
HGB: 12.9 g/dL (ref 11.6–15.9)
MCHC: 34.2 g/dL (ref 31.5–36.0)
MONO#: 0.5 10*3/uL (ref 0.1–0.9)
NEUT%: 76.7 % (ref 38.4–76.8)
WBC: 7.1 10*3/uL (ref 3.9–10.3)
lymph#: 0.9 10*3/uL (ref 0.9–3.3)

## 2010-12-21 LAB — COMPREHENSIVE METABOLIC PANEL
ALT: 34 U/L (ref 0–35)
Albumin: 3.9 g/dL (ref 3.5–5.2)
CO2: 23 mEq/L (ref 19–32)
Calcium: 8.9 mg/dL (ref 8.4–10.5)
Chloride: 105 mEq/L (ref 96–112)
Creatinine, Ser: 0.67 mg/dL (ref 0.50–1.10)
Potassium: 4.3 mEq/L (ref 3.5–5.3)
Total Protein: 6.2 g/dL (ref 6.0–8.3)

## 2010-12-22 ENCOUNTER — Other Ambulatory Visit: Payer: Self-pay

## 2011-01-06 ENCOUNTER — Other Ambulatory Visit: Payer: Self-pay

## 2011-04-01 ENCOUNTER — Encounter: Payer: Self-pay | Admitting: Oncology

## 2011-04-01 DIAGNOSIS — C50919 Malignant neoplasm of unspecified site of unspecified female breast: Secondary | ICD-10-CM | POA: Insufficient documentation

## 2011-04-01 DIAGNOSIS — I10 Essential (primary) hypertension: Secondary | ICD-10-CM | POA: Insufficient documentation

## 2011-04-16 ENCOUNTER — Encounter: Payer: Self-pay | Admitting: Oncology

## 2011-04-16 DIAGNOSIS — R748 Abnormal levels of other serum enzymes: Secondary | ICD-10-CM | POA: Insufficient documentation

## 2011-04-16 DIAGNOSIS — R21 Rash and other nonspecific skin eruption: Secondary | ICD-10-CM | POA: Insufficient documentation

## 2011-05-03 ENCOUNTER — Telehealth: Payer: Self-pay | Admitting: Oncology

## 2011-05-03 ENCOUNTER — Ambulatory Visit (HOSPITAL_BASED_OUTPATIENT_CLINIC_OR_DEPARTMENT_OTHER): Payer: PRIVATE HEALTH INSURANCE | Admitting: Oncology

## 2011-05-03 ENCOUNTER — Other Ambulatory Visit: Payer: Self-pay | Admitting: Oncology

## 2011-05-03 ENCOUNTER — Other Ambulatory Visit (HOSPITAL_BASED_OUTPATIENT_CLINIC_OR_DEPARTMENT_OTHER): Payer: PRIVATE HEALTH INSURANCE | Admitting: Lab

## 2011-05-03 VITALS — BP 184/101 | HR 87 | Temp 97.7°F | Ht 64.0 in | Wt 246.4 lb

## 2011-05-03 DIAGNOSIS — C50219 Malignant neoplasm of upper-inner quadrant of unspecified female breast: Secondary | ICD-10-CM

## 2011-05-03 DIAGNOSIS — I10 Essential (primary) hypertension: Secondary | ICD-10-CM

## 2011-05-03 DIAGNOSIS — Z853 Personal history of malignant neoplasm of breast: Secondary | ICD-10-CM

## 2011-05-03 LAB — CBC WITH DIFFERENTIAL/PLATELET
BASO%: 0.5 % (ref 0.0–2.0)
LYMPH%: 19.3 % (ref 14.0–49.7)
MCHC: 34.2 g/dL (ref 31.5–36.0)
MCV: 97.4 fL (ref 79.5–101.0)
MONO#: 0.5 10*3/uL (ref 0.1–0.9)
MONO%: 8.6 % (ref 0.0–14.0)
Platelets: 159 10*3/uL (ref 145–400)
RBC: 4.18 10*6/uL (ref 3.70–5.45)
WBC: 6 10*3/uL (ref 3.9–10.3)

## 2011-05-03 LAB — COMPREHENSIVE METABOLIC PANEL
ALT: 16 U/L (ref 0–35)
AST: 18 U/L (ref 0–37)
Albumin: 4.2 g/dL (ref 3.5–5.2)
CO2: 27 mEq/L (ref 19–32)
Calcium: 9.1 mg/dL (ref 8.4–10.5)
Chloride: 103 mEq/L (ref 96–112)
Creatinine, Ser: 0.77 mg/dL (ref 0.50–1.10)
Potassium: 3.9 mEq/L (ref 3.5–5.3)

## 2011-05-03 NOTE — Telephone Encounter (Signed)
gve the pt her April 2013 appt calendar 

## 2011-05-03 NOTE — Progress Notes (Signed)
[facility] OFFICE PROGRESS NOTE  CC:  Lindsey Brady, M.D.  Lindsey Brady, M.D.  Lindsey Brady, M.D.  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  PAST THERAPY:  She is status post four cycles of adjuvant chemotherapy with dose dense AC followed by dose dense Taxotere between 03/25/2009 and12/03/2009.  She is also s/p adjuvant radiation therapy.   CURRENT THERAPY:  watchful observation.  INTERVAL HISTORY: Lindsey Brady 70 y.o. female returns for her followed by herself. She feels well. She still works full-time as a Chiropractor. She denied any breast lesion. She is working full-time without fatigue. She denies headache, visit changes, mucositis, liver swallowing, nausea vomiting, shortness of breath, chest pain, abdominal pain, abdominal swelling, melena, hematochezia, hematochezia, melena, hematuria, vaginal bleeding, skin rash, low back pain.  MEDICAL HISTORY: Past Medical History  Diagnosis Date  . Hypertension   . Nephrolithiasis   . Breast cancer 2010    Tripple negative  . Skin rash   . Alkaline phosphatase elevation     ALLERGIES:  is allergic to ace inhibitors.  MEDICATIONS: Current Outpatient Prescriptions  Medication Sig Dispense Refill  . hydrochlorothiazide (HYDRODIURIL) 25 MG tablet Take 25 mg by mouth daily.        . metoprolol tartrate (LOPRESSOR) 25 MG tablet Take 25 mg by mouth 2 (two) times daily.          SURGICAL HISTORY:  Past Surgical History  Procedure Date  . Tonsilectomy, adenoidectomy, bilateral myringotomy and tubes     REVIEW OF SYSTEMS:  Pertinent items are noted in HPI.   PHYSICAL EXAMINATION: ECOG PERFORMANCE STATUS: 0-1  Filed Vitals:   05/03/11 1146  BP: 184/101  Pulse: 87  Temp: 97.7 F (36.5  C)   General:  well-nourished in no acute distress.  Eyes:  no scleral icterus.  ENT:  There were no oropharyngeal lesions.  Neck was without thyromegaly.  Lymphatics:  Negative cervical, supraclavicular or axillary adenopathy.  Respiratory: lungs were clear bilaterally without wheezing or crackles.  Cardiovascular:  Regular rate and rhythm, S1/S2, without murmur, rub or gallop.  There was no pedal edema.  GI:  abdomen was soft, flat, nontender, nondistended, without organomegaly.  Muscoloskeletal:  no spinal tenderness of palpation of vertebral spine.  Skin exam was without echymosis, petichae.  Neuro exam was nonfocal.  Patient was able to get on and off exam table without assistance.  Gait was normal.  Patient was alerted and oriented.  Attention was good.   Language was appropriate.  Mood was normal without depression.  Speech was not pressured.  Thought content was not tangential.   Bilateral breast exam was negative.     LABORATORY DATA: Results for orders placed in visit on 05/03/11 (from the past 48 hour(s))  CBC WITH DIFFERENTIAL     Status: Normal   Collection Time   05/03/11 10:54 AM      Component Value Range Comment   WBC 6.0  3.9 - 10.3 (10e3/uL)    NEUT# 4.2  1.5 - 6.5 (10e3/uL)    HGB 13.9  11.6 - 15.9 (g/dL)    HCT 91.4  78.2 - 95.6 (%)    Platelets 159  145 - 400 (10e3/uL)    MCV 97.4  79.5 - 101.0 (fL)    MCH 33.3  25.1 - 34.0 (pg)    MCHC 34.2  31.5 - 36.0 (g/dL)    RBC 1.61  0.96 - 0.45 (10e6/uL)    RDW 13.2  11.2 - 14.5 (%)    lymph# 1.2  0.9 - 3.3 (10e3/uL)    MONO# 0.5  0.1 - 0.9 (10e3/uL)    Eosinophils Absolute 0.1  0.0 - 0.5 (10e3/uL)    Basophils Absolute 0.0  0.0 - 0.1 (10e3/uL)    NEUT% 70.2  38.4 - 76.8 (%)    LYMPH% 19.3  14.0 - 49.7 (%)    MONO% 8.6  0.0 - 14.0 (%)    EOS% 1.4  0.0 - 7.0 (%)    BASO% 0.5  0.0 - 2.0 (%)     CMET:  Cr 0.77; LFT within normal limit; Ca 9.1  RADIOGRAPHIC STUDIES:  [facility] mammogram on 12/15/2010 was negative.     ASSESSMENT AND PLAN:    1. History of breast cancer:  I discussed with Lindsey Brady that per clinical history, physical exam, laboratory tests and mammogram, there was no evidence of disease recurrence or metastatic disease. I advised watchful observation.  She has follow up with me in 5 months.  She is due for bilateral right breast mammogram in June 2013. 2. Hypertension, slightly elevated systolic and diastolic blood pressure still.  She is on hydrochlorothiazide and metoprolol. I again strongly advised her to talk with her PCP about potential titration of her HTN medications and limits her salt intake. 3. Diffuse skin rash: resolved.  4. Slight elevation in alk phos:  Resolved.  5. Primary care:  She is up-to-date with her colonoscopy which was performed in May 2011 by Dr. Evette Cristal, which showed diverticulosis without any abnormal problems.  The next colonoscopy is due in 2021.

## 2011-10-04 ENCOUNTER — Other Ambulatory Visit (HOSPITAL_BASED_OUTPATIENT_CLINIC_OR_DEPARTMENT_OTHER): Payer: PRIVATE HEALTH INSURANCE | Admitting: Lab

## 2011-10-04 ENCOUNTER — Ambulatory Visit (HOSPITAL_BASED_OUTPATIENT_CLINIC_OR_DEPARTMENT_OTHER): Payer: PRIVATE HEALTH INSURANCE | Admitting: Oncology

## 2011-10-04 ENCOUNTER — Telehealth: Payer: Self-pay | Admitting: Oncology

## 2011-10-04 VITALS — BP 164/89 | HR 93 | Temp 98.0°F | Ht 64.0 in | Wt 221.1 lb

## 2011-10-04 DIAGNOSIS — C50219 Malignant neoplasm of upper-inner quadrant of unspecified female breast: Secondary | ICD-10-CM

## 2011-10-04 DIAGNOSIS — Z853 Personal history of malignant neoplasm of breast: Secondary | ICD-10-CM

## 2011-10-04 DIAGNOSIS — C50919 Malignant neoplasm of unspecified site of unspecified female breast: Secondary | ICD-10-CM

## 2011-10-04 LAB — CBC WITH DIFFERENTIAL/PLATELET
BASO%: 1.2 % (ref 0.0–2.0)
EOS%: 0.5 % (ref 0.0–7.0)
HCT: 42.3 % (ref 34.8–46.6)
MCH: 34.2 pg — ABNORMAL HIGH (ref 25.1–34.0)
MCHC: 34.4 g/dL (ref 31.5–36.0)
MONO#: 0.5 10*3/uL (ref 0.1–0.9)
RBC: 4.26 10*6/uL (ref 3.70–5.45)
RDW: 13.2 % (ref 11.2–14.5)
WBC: 5.8 10*3/uL (ref 3.9–10.3)
lymph#: 1.2 10*3/uL (ref 0.9–3.3)
nRBC: 0 % (ref 0–0)

## 2011-10-04 NOTE — Telephone Encounter (Signed)
gv pt appt for sept2013.  scheduled pt for mammogram on 06/24 @ [facility]

## 2011-10-04 NOTE — Patient Instructions (Signed)
1.  History of breast cancer: - No evidence of recurrent cancer.  - Repeat mammogram in 11/2011.   2.  Follow up with my NP Belenda Cruise in about 5 months.

## 2011-10-04 NOTE — Progress Notes (Signed)
[facility] OFFICE PROGRESS NOTE  CC:  [name]. [name], M.D.  Robert A. Nicholos Johns, M.D.  [name], M.D.  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  PAST THERAPY:  She is status post four cycles of adjuvant chemotherapy with dose dense AC followed by dose dense Taxotere between 03/25/2009 and12/03/2009.  She is also s/p adjuvant radiation therapy.   CURRENT THERAPY:  watchful observation.  INTERVAL HISTORY: Lindsey Brady 71 y.o. female returns for her followed by herself. She developed diarrhea for about couple of days duration 3 weeks ago which has completely resolved.  She also had abdominal cramp with that episode.  This has completely resolved as well.  With respect to her history of breast cancer, she does self breast exam sporadically, and has not found any abnormal breast mass.   Patient denies fatigue, headache, visual changes, confusion, drenching night sweats, palpable lymph node swelling, mucositis, odynophagia, dysphagia, nausea vomiting, jaundice, chest pain, palpitation, shortness of breath, dyspnea on exertion, productive cough, gum bleeding, epistaxis, hematemesis, hemoptysis, abdominal pain, abdominal swelling, early satiety, melena, hematochezia, hematuria, skin rash, spontaneous bleeding, joint swelling, joint pain, heat or cold intolerance, bowel bladder incontinence, back pain, focal motor weakness, paresthesia, depression, suicidal or homocidal ideation, feeling hopelessness.   MEDICAL HISTORY: Past Medical History  Diagnosis Date  . Hypertension   . Nephrolithiasis   . Breast cancer 2010    Tripple negative  . Skin rash   . Alkaline phosphatase elevation     ALLERGIES:  is allergic to ace inhibitors.  MEDICATIONS: Current Outpatient  Prescriptions  Medication Sig Dispense Refill  . amLODipine (NORVASC) 5 MG tablet Take 5 mg by mouth daily.        SURGICAL HISTORY:  Past Surgical History  Procedure Date  . Tonsilectomy, adenoidectomy, bilateral myringotomy and tubes     REVIEW OF SYSTEMS:  Pertinent items are noted in HPI.   PHYSICAL EXAMINATION: ECOG PERFORMANCE STATUS: 0-1  Filed Vitals:   10/04/11 1146  BP: 164/89  Pulse: 93  Temp: 98 F (36.7 C)   General:  well-nourished in no acute distress.  Eyes:  no scleral icterus.  ENT:  There were no oropharyngeal lesions.  Neck was without thyromegaly.  Lymphatics:  Negative cervical, supraclavicular or axillary adenopathy.  Respiratory: lungs were clear bilaterally without wheezing or crackles.  Cardiovascular:  Regular rate and rhythm, S1/S2, without murmur, rub or gallop.  There was no pedal edema.  GI:  abdomen was soft, flat, nontender, nondistended, without organomegaly.  Muscoloskeletal:  no spinal tenderness of palpation of vertebral spine.  Skin exam was without echymosis, petichae.  Neuro exam was nonfocal.  Patient was able to get on and off exam table without assistance.  Gait was normal.  Patient was alerted and oriented.  Attention was good.   Language was appropriate.  Mood was normal without depression.  Speech was not pressured.  Thought content was not tangential.   Bilateral breast exam was negative.     LABORATORY DATA: Results for orders placed in visit on 10/04/11 (from the past 48 hour(s))  CBC WITH DIFFERENTIAL     Status: Abnormal   Collection Time   10/04/11 11:32 AM      Component Value Range Comment   WBC  5.8  3.9 - 10.3 (10e3/uL)    NEUT# 4.1  1.5 - 6.5 (10e3/uL)    HGB 14.6  11.6 - 15.9 (g/dL)    HCT 14.7  82.9 - 56.2 (%)    Platelets 167  145 - 400 (10e3/uL)    MCV 99.3  79.5 - 101.0 (fL)    MCH 34.2 (*) 25.1 - 34.0 (pg)    MCHC 34.4  31.5 - 36.0 (g/dL)    RBC 1.30  8.65 - 7.84 (10e6/uL)    RDW 13.2  11.2 - 14.5 (%)    lymph#  1.2  0.9 - 3.3 (10e3/uL)    MONO# 0.5  0.1 - 0.9 (10e3/uL)    Eosinophils Absolute 0.0  0.0 - 0.5 (10e3/uL)    Basophils Absolute 0.1  0.0 - 0.1 (10e3/uL)    NEUT% 69.5  38.4 - 76.8 (%)    LYMPH% 21.1  14.0 - 49.7 (%)    MONO% 7.7  0.0 - 14.0 (%)    EOS% 0.5  0.0 - 7.0 (%)    BASO% 1.2  0.0 - 2.0 (%)    nRBC 0  0 - 0 (%)     CMET:  pending  RADIOGRAPHIC STUDIES:  [facility] mammogram on 12/15/2010 was negative.   ASSESSMENT AND PLAN:    1. History of breast cancer:  I discussed with Ms. Nowack that per clinical history, physical exam, laboratory tests and mammogram, there was no evidence of disease recurrence or metastatic disease. I advised watchful observation.  She has follow up in about 5 months.  I requested bilateral diagnostic breast mammogram for late June 2013. 2. Hypertension:   She is now on amlodipine per PCP.  Her BP is still slightly elevated.  I advised her to see her PCP for BP med titration as appropriate.  3. Age-appropriate cancer screening:  She is up-to-date with her colonoscopy which was performed in May 2011 by Dr. Evette Cristal, which showed diverticulosis without any abnormal problems.  The next colonoscopy is due in 2021. 4. Recent diarrhea/abdominal cramp:  Most likely viral in nature.  Her symptoms have resolved.   The length of time of the face-to-face encounter was 10 minutes. More than 50% of time was spent counseling and coordination of care.

## 2011-10-13 ENCOUNTER — Telehealth: Payer: Self-pay | Admitting: Oncology

## 2011-10-13 NOTE — Telephone Encounter (Signed)
lmonvm for pt re new time for 9/10 appt. Due to ML meeting new appt time is 845 am. New schedule mailed today.

## 2011-12-19 NOTE — Progress Notes (Signed)
Received office notes from Dr. Rosalva Ferron @ [facility]; forwarded to Dr. Gaylyn Rong.

## 2012-03-05 ENCOUNTER — Telehealth: Payer: Self-pay | Admitting: Oncology

## 2012-03-05 ENCOUNTER — Ambulatory Visit: Payer: PRIVATE HEALTH INSURANCE | Admitting: Oncology

## 2012-03-05 ENCOUNTER — Ambulatory Visit (HOSPITAL_BASED_OUTPATIENT_CLINIC_OR_DEPARTMENT_OTHER): Payer: BC Managed Care – PPO | Admitting: Oncology

## 2012-03-05 ENCOUNTER — Other Ambulatory Visit: Payer: PRIVATE HEALTH INSURANCE | Admitting: Lab

## 2012-03-05 ENCOUNTER — Other Ambulatory Visit (HOSPITAL_BASED_OUTPATIENT_CLINIC_OR_DEPARTMENT_OTHER): Payer: BC Managed Care – PPO | Admitting: Lab

## 2012-03-05 VITALS — BP 136/78 | HR 87 | Temp 97.8°F | Resp 20 | Ht 64.0 in | Wt 233.4 lb

## 2012-03-05 DIAGNOSIS — C50919 Malignant neoplasm of unspecified site of unspecified female breast: Secondary | ICD-10-CM

## 2012-03-05 LAB — CBC WITH DIFFERENTIAL/PLATELET
Eosinophils Absolute: 0.1 10*3/uL (ref 0.0–0.5)
LYMPH%: 22.6 % (ref 14.0–49.7)
MCHC: 34.1 g/dL (ref 31.5–36.0)
MCV: 96.8 fL (ref 79.5–101.0)
MONO%: 9.1 % (ref 0.0–14.0)
NEUT#: 3.3 10*3/uL (ref 1.5–6.5)
NEUT%: 65.3 % (ref 38.4–76.8)
Platelets: 173 10*3/uL (ref 145–400)
RBC: 3.96 10*6/uL (ref 3.70–5.45)

## 2012-03-05 LAB — COMPREHENSIVE METABOLIC PANEL (CC13)
Alkaline Phosphatase: 110 U/L (ref 40–150)
Creatinine: 0.9 mg/dL (ref 0.6–1.1)
Glucose: 129 mg/dl — ABNORMAL HIGH (ref 70–99)
Sodium: 143 mEq/L (ref 136–145)
Total Bilirubin: 0.6 mg/dL (ref 0.20–1.20)
Total Protein: 6.8 g/dL (ref 6.4–8.3)

## 2012-03-05 NOTE — Telephone Encounter (Signed)
appts made and printed for pt  °

## 2012-03-05 NOTE — Patient Instructions (Addendum)
1. Breast cancer: No evidence of recurrent cancer.     Repeat mammogram due 11/2012.  2. Follow-up in about 5 months.  Results for CORTINA, VULTAGGIO (MRN [id number]) as of 03/05/2012 09:37  Ref. Range 03/05/2012 08:58  WBC Latest Range: 4.0-10.5 K/uL 5.1  RBC Latest Range: 3.87-5.11 MIL/uL 3.96  Hemoglobin Latest Range: 12.0-15.0 g/dL 46.9  HCT Latest Range: 36.0-46.0 % 38.4  MCV Latest Range: 78.0-100.0 fL 96.8  MCH Latest Range: 25.1-34.0 pg 33.0  MCHC Latest Range: 30.0-36.0 g/dL 62.9  RDW Latest Range: 11.5-15.5 % 13.3  Platelets Latest Range: 150-400 K/uL 173  NEUT% Latest Range: 38.4-76.8 % 65.3  LYMPH% Latest Range: 14.0-49.7 % 22.6  MONO% Latest Range: 0.0-14.0 % 9.1  EOS% Latest Range: 0.0-7.0 % 1.9  BASO% Latest Range: 0.0-2.0 % 1.1  NEUT# Latest Range: 1.7-7.7 K/uL 3.3  MONO# Latest Range: 0.1-0.9 10e3/uL 0.5  Eosinophils Absolute Latest Range: 0.0-0.5 10e3/uL 0.1  Basophils Absolute Latest Range: 0.0-0.1 K/uL 0.1  lymph# Latest Range: 0.9-3.3 10e3/uL 1.2

## 2012-03-05 NOTE — Progress Notes (Signed)
[facility] OFFICE PROGRESS NOTE  CC:  Lindsey Brady. [name], M.D.  Robert A. Nicholos Johns, M.D.  [name], M.D.  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  PAST THERAPY:  She is status post four cycles of adjuvant chemotherapy with dose dense AC followed by dose dense Taxotere between 03/25/2009 and12/03/2009.  She is also s/p adjuvant radiation therapy.   CURRENT THERAPY:  watchful observation.  INTERVAL HISTORY: Lindsey Brady 71 y.o. female returns for her followed by herself. The patient reports that she had GI bleeding in July which has completely resolved. This was thought to be due to diverticulitis. With respect to her history of breast cancer, she does self breast exam sporadically, and has not found any abnormal breast mass. She continues to work full-time.  Patient denies fatigue, headache, visual changes, confusion, drenching night sweats, palpable lymph node swelling, mucositis, odynophagia, dysphagia, nausea vomiting, jaundice, chest pain, palpitation, shortness of breath, dyspnea on exertion, productive cough, gum bleeding, epistaxis, hematemesis, hemoptysis, abdominal pain, abdominal swelling, early satiety, melena, hematochezia, hematuria, skin rash, spontaneous bleeding, joint swelling, joint pain, heat or cold intolerance, bowel bladder incontinence, back pain, focal motor weakness, paresthesia, depression, suicidal or homocidal ideation, feeling hopelessness.   MEDICAL HISTORY: Past Medical History  Diagnosis Date  . Hypertension   . Nephrolithiasis   . Breast cancer 2010    Tripple negative  . Skin rash   . Alkaline phosphatase elevation     ALLERGIES:  is allergic to ace inhibitors.  MEDICATIONS: Current Outpatient Prescriptions  Medication  Sig Dispense Refill  . ferrous sulfate 325 (65 FE) MG tablet Take 325 mg by mouth daily with breakfast.      . furosemide (LASIX) 20 MG tablet Take 20 mg by mouth daily.      . potassium chloride SA (K-DUR,KLOR-CON) 20 MEQ tablet Take 20 mEq by mouth daily.      Marland Kitchen amLODipine (NORVASC) 5 MG tablet Take 10 mg by mouth daily.         SURGICAL HISTORY:  Past Surgical History  Procedure Date  . Tonsilectomy, adenoidectomy, bilateral myringotomy and tubes     REVIEW OF SYSTEMS:  Pertinent items are noted in HPI.   PHYSICAL EXAMINATION: ECOG PERFORMANCE STATUS: 0-1  Filed Vitals:   03/05/12 0920  BP: 136/78  Pulse: 87  Temp: 97.8 F (36.6 C)  Resp: 20   General:  well-nourished in no acute distress.  Eyes:  no scleral icterus.  ENT:  There were no oropharyngeal lesions.  Neck was without thyromegaly.  Lymphatics:  Negative cervical, supraclavicular or axillary adenopathy.  Respiratory: lungs were clear bilaterally without wheezing or crackles.  Cardiovascular:  Regular rate and rhythm, S1/S2, without murmur, rub or gallop.  There was no pedal edema.  GI:  abdomen was soft, flat, nontender, nondistended, without organomegaly.  Muscoloskeletal:  no spinal tenderness of palpation of vertebral spine.  Skin exam was without echymosis, petichae.  Neuro exam was nonfocal.  Patient was able to get on and off exam table without assistance.  Gait was normal.  Patient was alerted and oriented.  Attention was good.   Language was appropriate.  Mood was normal without depression.  Speech was not pressured.  Thought content was not tangential.   Bilateral  breast exam was negative.    LABORATORY DATA: Results for orders placed in visit on 03/05/12 (from the past 48 hour(s))  CBC WITH DIFFERENTIAL     Status: Normal   Collection Time   03/05/12  8:58 AM      Component Value Range Comment   WBC 5.1  3.9 - 10.3 10e3/uL    NEUT# 3.3  1.5 - 6.5 10e3/uL    HGB 13.1  11.6 - 15.9 g/dL    HCT 65.7  84.6 - 96.2  %    Platelets 173  145 - 400 10e3/uL    MCV 96.8  79.5 - 101.0 fL    MCH 33.0  25.1 - 34.0 pg    MCHC 34.1  31.5 - 36.0 g/dL    RBC 9.52  8.41 - 3.24 10e6/uL    RDW 13.3  11.2 - 14.5 %    lymph# 1.2  0.9 - 3.3 10e3/uL    MONO# 0.5  0.1 - 0.9 10e3/uL    Eosinophils Absolute 0.1  0.0 - 0.5 10e3/uL    Basophils Absolute 0.1  0.0 - 0.1 10e3/uL    NEUT% 65.3  38.4 - 76.8 %    LYMPH% 22.6  14.0 - 49.7 %    MONO% 9.1  0.0 - 14.0 %    EOS% 1.9  0.0 - 7.0 %    BASO% 1.1  0.0 - 2.0 %   COMPREHENSIVE METABOLIC PANEL (CC13)     Status: Abnormal   Collection Time   03/05/12  8:58 AM      Component Value Range Comment   Sodium 143  136 - 145 mEq/L    Potassium 3.7  3.5 - 5.1 mEq/L    Chloride 106  98 - 107 mEq/L    CO2 24  22 - 29 mEq/L    Glucose 129 (*) 70 - 99 mg/dl    BUN 40.1  7.0 - 02.7 mg/dL    Creatinine 0.9  0.6 - 1.1 mg/dL    Total Bilirubin 2.53  0.20 - 1.20 mg/dL    Alkaline Phosphatase 110  40 - 150 U/L    AST 15  5 - 34 U/L    ALT 14  0 - 55 U/L    Total Protein 6.8  6.4 - 8.3 g/dL    Albumin 3.4 (*) 3.5 - 5.0 g/dL    Calcium 9.3  8.4 - 66.4 mg/dL      ASSESSMENT AND PLAN:    1. History of breast cancer:  I discussed with Ms. Mote that per clinical history, physical exam, laboratory tests and mammogram, there was no evidence of disease recurrence or metastatic disease. I advised watchful observation.  Last mammogram was performed in June 2013 and was negative. Mammogram is due again in June 2014. 2. Hypertension:  On amlodipine per PCP.   3. Age-appropriate cancer screening:  She is up-to-date with her colonoscopy which was performed in May 2011 by Dr. Evette Cristal, which showed diverticulosis without any abnormal problems.  The next colonoscopy is due in 2021. 4. Follow-up: In 5 months.   The length of time of the face-to-face encounter was 20 minutes. More than 50% of time was spent counseling and coordination of care.

## 2012-08-04 NOTE — Patient Instructions (Addendum)
1.  History of breast cancer.  Continue to be in remission.  Next surveillance mammogram in June 2014 2.  Follow up:  In about 8 months.

## 2012-08-05 ENCOUNTER — Other Ambulatory Visit (HOSPITAL_BASED_OUTPATIENT_CLINIC_OR_DEPARTMENT_OTHER): Payer: BC Managed Care – PPO

## 2012-08-05 ENCOUNTER — Telehealth: Payer: Self-pay | Admitting: Oncology

## 2012-08-05 ENCOUNTER — Ambulatory Visit (HOSPITAL_BASED_OUTPATIENT_CLINIC_OR_DEPARTMENT_OTHER): Payer: BC Managed Care – PPO | Admitting: Oncology

## 2012-08-05 VITALS — BP 169/94 | HR 84 | Temp 97.2°F | Resp 20 | Ht 64.0 in | Wt 247.7 lb

## 2012-08-05 DIAGNOSIS — C50919 Malignant neoplasm of unspecified site of unspecified female breast: Secondary | ICD-10-CM

## 2012-08-05 DIAGNOSIS — R5381 Other malaise: Secondary | ICD-10-CM | POA: Insufficient documentation

## 2012-08-05 DIAGNOSIS — Z853 Personal history of malignant neoplasm of breast: Secondary | ICD-10-CM

## 2012-08-05 DIAGNOSIS — E669 Obesity, unspecified: Secondary | ICD-10-CM | POA: Insufficient documentation

## 2012-08-05 DIAGNOSIS — I10 Essential (primary) hypertension: Secondary | ICD-10-CM

## 2012-08-05 LAB — COMPREHENSIVE METABOLIC PANEL (CC13)
ALT: 17 U/L (ref 0–55)
AST: 16 U/L (ref 5–34)
CO2: 30 mEq/L — ABNORMAL HIGH (ref 22–29)
Calcium: 9.4 mg/dL (ref 8.4–10.4)
Chloride: 106 mEq/L (ref 98–107)
Creatinine: 0.8 mg/dL (ref 0.6–1.1)
Sodium: 146 mEq/L — ABNORMAL HIGH (ref 136–145)
Total Bilirubin: 0.46 mg/dL (ref 0.20–1.20)
Total Protein: 7 g/dL (ref 6.4–8.3)

## 2012-08-05 LAB — CBC WITH DIFFERENTIAL/PLATELET
Basophils Absolute: 0 10*3/uL (ref 0.0–0.1)
Eosinophils Absolute: 0.1 10*3/uL (ref 0.0–0.5)
HCT: 39.6 % (ref 34.8–46.6)
HGB: 13.7 g/dL (ref 11.6–15.9)
LYMPH%: 24 % (ref 14.0–49.7)
MCV: 95.4 fL (ref 79.5–101.0)
MONO%: 8.7 % (ref 0.0–14.0)
NEUT#: 3.7 10*3/uL (ref 1.5–6.5)
NEUT%: 64.8 % (ref 38.4–76.8)
Platelets: 163 10*3/uL (ref 145–400)
RBC: 4.15 10*6/uL (ref 3.70–5.45)

## 2012-08-05 NOTE — Telephone Encounter (Signed)
gv and printed pt appt schedule for June and Oct....schedule mammo for June 25th @ 900am

## 2012-08-05 NOTE — Progress Notes (Signed)
[facility] OFFICE PROGRESS NOTE  CC:  [name]. [name], M.D.  Robert A. Nicholos Johns, M.D.  [name], M.D.  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  PAST THERAPY:  She is status post four cycles of adjuvant chemotherapy with dose dense AC followed by dose dense Taxotere between 03/25/2009 and12/03/2009.  She is also s/p adjuvant radiation therapy.   CURRENT THERAPY:  watchful observation.  INTERVAL HISTORY: Lindsey Brady 72 y.o. female returns for her followed by herself. She reported still working full time.  She does not find much time to work out.  Her weight continue to increase.  She feels DOE walking about 20 feet.  She has bilateral pedal edema and has been taking diuretics.  She denied chest pain, palpitation, orthopnea, PND.  She performs self breast exam and has not found any abnormal breast mass.   Patient denies fever, anorexia, weight loss, fatigue, headache, visual changes, confusion, drenching night sweats, palpable lymph node swelling, mucositis, odynophagia, dysphagia, nausea vomiting, jaundice, productive cough, gum bleeding, epistaxis, hematemesis, hemoptysis, abdominal pain, abdominal swelling, early satiety, melena, hematochezia, hematuria, skin rash, spontaneous bleeding, joint swelling, joint pain, heat or cold intolerance, bowel bladder incontinence, back pain, focal motor weakness, paresthesia, depression.    MEDICAL HISTORY: Past Medical History  Diagnosis Date  . Hypertension   . Nephrolithiasis   . Breast cancer 2010    Tripple negative  . Skin rash   . Alkaline phosphatase elevation     ALLERGIES:  is allergic to ace inhibitors.  MEDICATIONS: Current Outpatient Prescriptions  Medication Sig Dispense Refill  . carvedilol (COREG)  3.125 MG tablet Take 3.125 mg by mouth 2 (two) times daily with a meal.      . ferrous sulfate 325 (65 FE) MG tablet Take 325 mg by mouth daily with breakfast.      . furosemide (LASIX) 20 MG tablet Take 20 mg by mouth daily as needed (swelling).       . potassium chloride SA (K-DUR,KLOR-CON) 20 MEQ tablet Take 20 mEq by mouth daily as needed. W/ lasix       No current facility-administered medications for this visit.    SURGICAL HISTORY:  Past Surgical History  Procedure Laterality Date  . Tonsilectomy, adenoidectomy, bilateral myringotomy and tubes      REVIEW OF SYSTEMS:  Pertinent items are noted in HPI.   PHYSICAL EXAMINATION: ECOG PERFORMANCE STATUS: 0-1  Filed Vitals:   08/05/12 1038  BP: 169/94  Pulse: 84  Temp: 97.2 F (36.2 C)  Resp: 20   General:  Mildly obese woman, in no acute distress.  Eyes:  no scleral icterus.  ENT:  There were no oropharyngeal lesions.  Neck was without thyromegaly.  Lymphatics:  Negative cervical, supraclavicular or axillary adenopathy.  Respiratory: lungs were clear bilaterally without wheezing or crackles.  Cardiovascular:  Regular rate and rhythm, S1/S2, without murmur, rub or gallop.  There was no pedal edema.  GI:  abdomen was soft, flat, nontender, nondistended, without organomegaly.  Muscoloskeletal:  no spinal tenderness of palpation of vertebral spine.  Skin exam was without echymosis, petichae.  Neuro exam was nonfocal.  Patient was able to get on and off exam table without assistance.  Gait was normal.  Patient was alerted and  oriented.  Attention was good.   Language was appropriate.  Mood was normal without depression.  Speech was not pressured.  Thought content was not tangential.   Bilateral breast exam was negative.     LABORATORY DATA: Results for orders placed in visit on 08/05/12 (from the past 48 hour(s))  CBC WITH DIFFERENTIAL     Status: None   Collection Time    08/05/12 10:10 AM      Result Value Range   WBC 5.6  3.9 -  10.3 10e3/uL   NEUT# 3.7  1.5 - 6.5 10e3/uL   HGB 13.7  11.6 - 15.9 g/dL   HCT 11.9  14.7 - 82.9 %   Platelets 163  145 - 400 10e3/uL   MCV 95.4  79.5 - 101.0 fL   MCH 33.0  25.1 - 34.0 pg   MCHC 34.6  31.5 - 36.0 g/dL   RBC 5.62  1.30 - 8.65 10e6/uL   RDW 13.9  11.2 - 14.5 %   lymph# 1.4  0.9 - 3.3 10e3/uL   MONO# 0.5  0.1 - 0.9 10e3/uL   Eosinophils Absolute 0.1  0.0 - 0.5 10e3/uL   Basophils Absolute 0.0  0.0 - 0.1 10e3/uL   NEUT% 64.8  38.4 - 76.8 %   LYMPH% 24.0  14.0 - 49.7 %   MONO% 8.7  0.0 - 14.0 %   EOS% 1.7  0.0 - 7.0 %   BASO% 0.8  0.0 - 2.0 %  COMPREHENSIVE METABOLIC PANEL (CC13)     Status: Abnormal   Collection Time    08/05/12 10:10 AM      Result Value Range   Sodium 146 (*) 136 - 145 mEq/L   Potassium 4.3  3.5 - 5.1 mEq/L   Chloride 106  98 - 107 mEq/L   CO2 30 (*) 22 - 29 mEq/L   Glucose 113 (*) 70 - 99 mg/dl   BUN 78.4  7.0 - 69.6 mg/dL   Creatinine 0.8  0.6 - 1.1 mg/dL   Total Bilirubin 2.95  0.20 - 1.20 mg/dL   Alkaline Phosphatase 120  40 - 150 U/L   AST 16  5 - 34 U/L   ALT 17  0 - 55 U/L   Total Protein 7.0  6.4 - 8.3 g/dL   Albumin 3.4 (*) 3.5 - 5.0 g/dL   Calcium 9.4  8.4 - 28.4 mg/dL      RADIOGRAPHIC STUDIES:  [facility] mammogram on 12/15/2011 was negative.   ASSESSMENT AND PLAN:    1. History of breast cancer:  Continue to be in remission.  There is no sign of recurrent or metastatic disease on clinical history, physical exam, lab.  She asked if there is indication for routine screening CT.  I advised her that CT is not indicated unless she has concerning symptoms or abnormal LFT's.  2. Hypertension:   She is now on carvedilol and Lasix per PCP.  Her BP is still slightly elevated.  3. Age-appropriate cancer screening:  She is up-to-date with her colonoscopy which was performed in May 2011 by Dr. Evette Cristal, which showed diverticulosis without any abnormal problems.  The next colonoscopy is supposed to be due in 2021.  She had one episode of  hematochezia last year which she attributed to diverticulosis.  Her paternal grandfather had colon cancer.  I advised to discussed with Dr. Evette Cristal to see if screening colonoscopy is appropriate sooner than 2021 if she has recurrent hematochezia.  4. Deconditioning:  Most  likely due to obesity and lack of exercise.  I advised her again to cut down work and instead work out.  If her SOB and DOE significantly worsened, then further work up may be considered.  5. Follow up:  In about 8 months.   The length of time of the face-to-face encounter was 15 minutes. More than 50% of time was spent counseling and coordination of care.

## 2013-01-29 ENCOUNTER — Other Ambulatory Visit: Payer: Self-pay

## 2013-04-03 ENCOUNTER — Other Ambulatory Visit: Payer: Self-pay | Admitting: *Deleted

## 2013-04-03 DIAGNOSIS — C50919 Malignant neoplasm of unspecified site of unspecified female breast: Secondary | ICD-10-CM

## 2013-04-04 ENCOUNTER — Other Ambulatory Visit (HOSPITAL_BASED_OUTPATIENT_CLINIC_OR_DEPARTMENT_OTHER): Payer: BC Managed Care – PPO | Admitting: Lab

## 2013-04-04 ENCOUNTER — Ambulatory Visit (HOSPITAL_BASED_OUTPATIENT_CLINIC_OR_DEPARTMENT_OTHER): Payer: BC Managed Care – PPO | Admitting: Oncology

## 2013-04-04 ENCOUNTER — Encounter: Payer: Self-pay | Admitting: Oncology

## 2013-04-04 VITALS — BP 125/82 | HR 80 | Temp 97.2°F | Resp 18 | Wt 235.0 lb

## 2013-04-04 DIAGNOSIS — Z170 Estrogen receptor positive status [ER+]: Secondary | ICD-10-CM

## 2013-04-04 DIAGNOSIS — R609 Edema, unspecified: Secondary | ICD-10-CM

## 2013-04-04 DIAGNOSIS — C50919 Malignant neoplasm of unspecified site of unspecified female breast: Secondary | ICD-10-CM

## 2013-04-04 DIAGNOSIS — I10 Essential (primary) hypertension: Secondary | ICD-10-CM

## 2013-04-04 LAB — CBC WITH DIFFERENTIAL/PLATELET
BASO%: 1.4 % (ref 0.0–2.0)
EOS%: 2 % (ref 0.0–7.0)
HCT: 37.9 % (ref 34.8–46.6)
LYMPH%: 22.3 % (ref 14.0–49.7)
MCH: 32.4 pg (ref 25.1–34.0)
MCHC: 33.7 g/dL (ref 31.5–36.0)
NEUT%: 65 % (ref 38.4–76.8)
Platelets: 163 10*3/uL (ref 145–400)
RBC: 3.94 10*6/uL (ref 3.70–5.45)
WBC: 5.4 10*3/uL (ref 3.9–10.3)

## 2013-04-04 LAB — COMPREHENSIVE METABOLIC PANEL (CC13)
ALT: 10 U/L (ref 0–55)
AST: 15 U/L (ref 5–34)
Alkaline Phosphatase: 101 U/L (ref 40–150)
Creatinine: 0.8 mg/dL (ref 0.6–1.1)
Sodium: 143 mEq/L (ref 136–145)
Total Bilirubin: 0.77 mg/dL (ref 0.20–1.20)
Total Protein: 7 g/dL (ref 6.4–8.3)

## 2013-04-04 NOTE — Progress Notes (Signed)
[facility] OFFICE PROGRESS NOTE  CC:  [name]. [name], M.D.  Robert A. Nicholos Johns, M.D.  [name], M.D.  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  PAST THERAPY:  She is status post four cycles of adjuvant chemotherapy with dose dense AC followed by dose dense Taxotere between 03/25/2009 and12/03/2009.  She is also s/p adjuvant radiation therapy.   CURRENT THERAPY:  watchful observation.  INTERVAL HISTORY: Lindsey Brady 72 y.o. female returns for her followed by herself. She reported still working full time.  She does not find much time to work out.  Her weight continue to increase.  She feels DOE walking about 20 feet.  She has bilateral pedal edema and has been taking diuretics.  She denied chest pain, palpitation, orthopnea, PND.  She performs self breast exam and has not found any abnormal breast mass.   Patient denies fever, anorexia, weight loss, fatigue, headache, visual changes, confusion, drenching night sweats, palpable lymph node swelling, mucositis, odynophagia, dysphagia, nausea vomiting, jaundice, productive cough, gum bleeding, epistaxis, hematemesis, hemoptysis, abdominal pain, abdominal swelling, early satiety, melena, hematochezia, hematuria, skin rash, spontaneous bleeding, joint swelling, joint pain, heat or cold intolerance, bowel bladder incontinence, back pain, focal motor weakness, paresthesia, depression.    MEDICAL HISTORY: Past Medical History  Diagnosis Date  . Hypertension   . Nephrolithiasis   . Breast cancer 2010    Tripple negative  . Skin rash   . Alkaline phosphatase elevation     ALLERGIES:  is allergic to ace inhibitors.  MEDICATIONS: Current Outpatient Prescriptions  Medication Sig Dispense Refill  . amLODipine (NORVASC)  5 MG tablet Take 5 mg by mouth daily.      Marland Kitchen olmesartan (BENICAR) 20 MG tablet Take 20 mg by mouth daily.      . carvedilol (COREG) 3.125 MG tablet Take 3.125 mg by mouth 2 (two) times daily with a meal.      . furosemide (LASIX) 20 MG tablet Take 20 mg by mouth daily as needed (swelling).       . potassium chloride SA (K-DUR,KLOR-CON) 20 MEQ tablet Take 20 mEq by mouth daily as needed. W/ lasix       No current facility-administered medications for this visit.    SURGICAL HISTORY:  Past Surgical History  Procedure Laterality Date  . Tonsilectomy, adenoidectomy, bilateral myringotomy and tubes      REVIEW OF SYSTEMS:  Pertinent items are noted in HPI.   PHYSICAL EXAMINATION: ECOG PERFORMANCE STATUS: 0-1  Filed Vitals:   04/04/13 0904  BP: 125/82  Pulse: 80  Temp: 97.2 F (36.2 C)  Resp: 18   General:  Mildly obese woman, in no acute distress.  Eyes:  no scleral icterus.  ENT:  There were no oropharyngeal lesions.  Neck was without thyromegaly.  Lymphatics:  Negative cervical, supraclavicular or axillary adenopathy.  Respiratory: lungs were clear bilaterally without wheezing or crackles.  Cardiovascular:  Regular rate and rhythm, S1/S2, without murmur, rub or gallop.  There was no pedal edema.  GI:  abdomen was soft, flat, nontender, nondistended, without organomegaly.  Muscoloskeletal:  no spinal tenderness of palpation of vertebral spine.  Skin exam was without echymosis, petichae.  Neuro exam was nonfocal.  Patient was able to get on and off  exam table without assistance.  Gait was normal.  Patient was alerted and oriented.  Attention was good.   Language was appropriate.  Mood was normal without depression.  Speech was not pressured.  Thought content was not tangential.   Bilateral breast exam was negative.     LABORATORY DATA: Results for orders placed in visit on 04/04/13 (from the past 48 hour(s))  CBC WITH DIFFERENTIAL     Status: None   Collection Time    04/04/13  8:09 AM       Result Value Range   WBC 5.4  3.9 - 10.3 10e3/uL   NEUT# 3.5  1.5 - 6.5 10e3/uL   HGB 12.8  11.6 - 15.9 g/dL   HCT 29.5  62.1 - 30.8 %   Platelets 163  145 - 400 10e3/uL   MCV 96.2  79.5 - 101.0 fL   MCH 32.4  25.1 - 34.0 pg   MCHC 33.7  31.5 - 36.0 g/dL   RBC 6.57  8.46 - 9.62 10e6/uL   RDW 13.2  11.2 - 14.5 %   lymph# 1.2  0.9 - 3.3 10e3/uL   MONO# 0.5  0.1 - 0.9 10e3/uL   Eosinophils Absolute 0.1  0.0 - 0.5 10e3/uL   Basophils Absolute 0.1  0.0 - 0.1 10e3/uL   NEUT% 65.0  38.4 - 76.8 %   LYMPH% 22.3  14.0 - 49.7 %   MONO% 9.3  0.0 - 14.0 %   EOS% 2.0  0.0 - 7.0 %   BASO% 1.4  0.0 - 2.0 %  COMPREHENSIVE METABOLIC PANEL (CC13)     Status: Abnormal   Collection Time    04/04/13  8:09 AM      Result Value Range   Sodium 143  136 - 145 mEq/L   Potassium 3.9  3.5 - 5.1 mEq/L   Chloride 109  98 - 109 mEq/L   CO2 24  22 - 29 mEq/L   Glucose 107  70 - 140 mg/dl   BUN 95.2  7.0 - 84.1 mg/dL   Creatinine 0.8  0.6 - 1.1 mg/dL   Total Bilirubin 3.24  0.20 - 1.20 mg/dL   Alkaline Phosphatase 101  40 - 150 U/L   AST 15  5 - 34 U/L   ALT 10  0 - 55 U/L   Total Protein 7.0  6.4 - 8.3 g/dL   Albumin 3.4 (*) 3.5 - 5.0 g/dL   Calcium 9.1  8.4 - 40.1 mg/dL   Anion Gap 11  3 - 11 mEq/L      RADIOGRAPHIC STUDIES:  [facility] mammogram on 12/18/2012 was negative.   ASSESSMENT AND PLAN:    1. History of breast cancer:  Continue to be in remission.  There is no sign of recurrent or metastatic disease on clinical history, physical exam, lab.  Mammogram was negative in June 2014. 2. Hypertension:   She is now on carvedilol, amlodipine, Benicar, and Lasix PRN per PCP.    3. Age-appropriate cancer screening:  She is up-to-date with her colonoscopy which was performed in May 2011 by Dr. Evette Cristal, which showed diverticulosis without any abnormal problems.  The next colonoscopy is supposed to be due in 2021.  She had one episode of hematochezia last year which she attributed to diverticulosis.  Her  paternal grandfather had colon cancer.  I advised to discussed with Dr. Evette Cristal to see if screening colonoscopy is appropriate sooner than 2021 if she has recurrent hematochezia. Patient will receive flu  vaccine at her place of employment within the next 1-2 weeks. 4. Deconditioning:  Most likely due to obesity and lack of exercise.  I advised her again to cut down work and instead work out.  If her SOB and DOE significantly worsened, then further work up may be considered.  5. Follow up:  In about 8 months.   The length of time of the face-to-face encounter was 15 minutes. More than 50% of time was spent counseling and coordination of care.

## 2013-04-08 ENCOUNTER — Telehealth: Payer: Self-pay | Admitting: Hematology and Oncology

## 2013-04-08 NOTE — Telephone Encounter (Signed)
Called pt,left message regarding appt for lab and Md on 10/17

## 2013-04-09 ENCOUNTER — Telehealth: Payer: Self-pay | Admitting: Hematology and Oncology

## 2013-04-09 NOTE — Telephone Encounter (Signed)
Talked to pt and she is aware of appt for June 2015 labs and MD

## 2013-04-11 ENCOUNTER — Ambulatory Visit: Payer: Medicare Other | Admitting: Hematology and Oncology

## 2013-04-11 ENCOUNTER — Other Ambulatory Visit: Payer: Medicare Other | Admitting: Lab

## 2013-05-01 ENCOUNTER — Other Ambulatory Visit: Payer: Self-pay

## 2013-12-03 ENCOUNTER — Encounter: Payer: Self-pay | Admitting: Hematology and Oncology

## 2013-12-03 ENCOUNTER — Other Ambulatory Visit (HOSPITAL_BASED_OUTPATIENT_CLINIC_OR_DEPARTMENT_OTHER): Payer: BC Managed Care – PPO

## 2013-12-03 ENCOUNTER — Ambulatory Visit (HOSPITAL_BASED_OUTPATIENT_CLINIC_OR_DEPARTMENT_OTHER): Payer: BC Managed Care – PPO | Admitting: Hematology and Oncology

## 2013-12-03 VITALS — BP 129/80 | HR 78 | Temp 98.0°F | Resp 20 | Ht 64.0 in | Wt 257.4 lb

## 2013-12-03 DIAGNOSIS — R600 Localized edema: Secondary | ICD-10-CM

## 2013-12-03 DIAGNOSIS — G589 Mononeuropathy, unspecified: Secondary | ICD-10-CM

## 2013-12-03 DIAGNOSIS — G629 Polyneuropathy, unspecified: Secondary | ICD-10-CM

## 2013-12-03 DIAGNOSIS — C50919 Malignant neoplasm of unspecified site of unspecified female breast: Secondary | ICD-10-CM

## 2013-12-03 DIAGNOSIS — R609 Edema, unspecified: Secondary | ICD-10-CM

## 2013-12-03 LAB — COMPREHENSIVE METABOLIC PANEL (CC13)
ALT: 15 U/L (ref 0–55)
AST: 15 U/L (ref 5–34)
Albumin: 3.2 g/dL — ABNORMAL LOW (ref 3.5–5.0)
Alkaline Phosphatase: 96 U/L (ref 40–150)
Anion Gap: 8 mEq/L (ref 3–11)
BILIRUBIN TOTAL: 0.66 mg/dL (ref 0.20–1.20)
BUN: 17.1 mg/dL (ref 7.0–26.0)
CO2: 28 meq/L (ref 22–29)
Calcium: 9.1 mg/dL (ref 8.4–10.4)
Chloride: 106 mEq/L (ref 98–109)
Creatinine: 0.8 mg/dL (ref 0.6–1.1)
Glucose: 129 mg/dl (ref 70–140)
POTASSIUM: 4.4 meq/L (ref 3.5–5.1)
SODIUM: 142 meq/L (ref 136–145)
TOTAL PROTEIN: 6.7 g/dL (ref 6.4–8.3)

## 2013-12-03 LAB — CBC WITH DIFFERENTIAL/PLATELET
BASO%: 0.7 % (ref 0.0–2.0)
Basophils Absolute: 0.1 10*3/uL (ref 0.0–0.1)
EOS%: 1.9 % (ref 0.0–7.0)
Eosinophils Absolute: 0.1 10*3/uL (ref 0.0–0.5)
HCT: 38 % (ref 34.8–46.6)
HGB: 12.6 g/dL (ref 11.6–15.9)
LYMPH#: 1.6 10*3/uL (ref 0.9–3.3)
LYMPH%: 21.1 % (ref 14.0–49.7)
MCH: 32.3 pg (ref 25.1–34.0)
MCHC: 33.1 g/dL (ref 31.5–36.0)
MCV: 97.8 fL (ref 79.5–101.0)
MONO#: 0.7 10*3/uL (ref 0.1–0.9)
MONO%: 9.7 % (ref 0.0–14.0)
NEUT#: 4.9 10*3/uL (ref 1.5–6.5)
NEUT%: 66.6 % (ref 38.4–76.8)
Platelets: 168 10*3/uL (ref 145–400)
RBC: 3.89 10*6/uL (ref 3.70–5.45)
RDW: 13.6 % (ref 11.2–14.5)
WBC: 7.4 10*3/uL (ref 3.9–10.3)

## 2013-12-03 NOTE — Assessment & Plan Note (Signed)
She is now almost 5 years out from her original diagnosis. Recent mammogram was negative. Examination is benign. I will discharge her from the clinic and recommend she follows closely with her primary care provider.

## 2013-12-03 NOTE — Progress Notes (Signed)
[place] FOLLOW-UP progress notes  Patient Care Team: Maury Dus, MD as PCP - General (Family Medicine) Lora Paula, MD as Attending Physician (Radiation Oncology) Merrie Roof, MD as Attending Physician (General Surgery) Heath Lark, MD (Hematology and Oncology)  CHIEF COMPLAINTS/PURPOSE OF VISIT:  T1, N0, M0 left breast cancer status post lumpectomy, chemotherapy and radiation therapy  HISTORY OF PRESENTING ILLNESS:  Lindsey Brady 73 y.o. female was transferred to my care after her prior physician has left.  I reviewed the patient's records extensive and collaborated the history with the patient. Summary of her history is as follows: She was diagnosed with breast cancer after incidental finding on imaging study. 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 She is status post four cycles of adjuvant chemotherapy with dose dense AC followed by dose dense Taxotere between 03/25/2009 and12/03/2009.  She is also s/p adjuvant radiation therapy.   She denies any recent abnormal breast examination, palpable mass, abnormal breast appearance or nipple changes She complained of severe bilateral lower extremity edema. 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  MEDICAL HISTORY:  Past Medical History  Diagnosis Date  . Hypertension   . Nephrolithiasis   . Breast cancer 2010    Tripple negative  . Skin rash   . Alkaline phosphatase elevation     SURGICAL HISTORY: Past Surgical History  Procedure Laterality Date  . Tonsilectomy, adenoidectomy, bilateral myringotomy and tubes      SOCIAL HISTORY: History   Social History  . Marital Status: Widowed    Spouse Name: N/A    Number of Children: N/A  . Years of Education:  N/A   Occupational History  . Not on file.   Social History Main Topics  . Smoking status: Never Smoker   . Smokeless tobacco: Never Used  . Alcohol Use: No  . Drug Use: No  . Sexual Activity: Not on file   Other Topics Concern  . Not on file   Social History Narrative  . No narrative on file    FAMILY HISTORY: Family History  Problem Relation Age of Onset  . Cancer Maternal Aunt     ?abdominal ca  . Cancer Paternal Aunt     breast ca  . Cancer Paternal Grandfather     intestinal cancer    ALLERGIES:  is allergic to ace inhibitors.  MEDICATIONS:  Current Outpatient Prescriptions  Medication Sig Dispense Refill  . amLODipine (NORVASC) 5 MG tablet Take 5 mg by mouth daily.      . carvedilol (COREG) 3.125 MG tablet Take 3.125 mg by mouth 2 (two) times daily with a meal.      . furosemide (LASIX) 20 MG tablet Take 20 mg by mouth daily as needed (swelling).       Marland Kitchen olmesartan (BENICAR) 20 MG tablet Take 20 mg by mouth daily.      . potassium chloride SA (K-DUR,KLOR-CON) 20 MEQ tablet Take 20 mEq by mouth daily as needed. W/ lasix       No current facility-administered medications for this visit.    REVIEW OF SYSTEMS:   Constitutional: Denies fevers, chills or abnormal night sweats Eyes: Denies blurriness of vision, double vision or watery  eyes Ears, nose, mouth, throat, and face: Denies mucositis or sore throat Respiratory: Denies cough, dyspnea or wheezes Cardiovascular: Denies palpitation, chest discomfort  Gastrointestinal:  Denies nausea, heartburn or change in bowel habits Skin: Denies abnormal skin rashes Lymphatics: Denies new lymphadenopathy or easy bruising Behavioral/Psych: Mood is stable, no new changes  All other systems were reviewed with the patient and are negative.  PHYSICAL EXAMINATION: ECOG PERFORMANCE STATUS: 1 - Symptomatic but completely ambulatory  Filed Vitals:   12/03/13 0939  BP: 129/80  Pulse: 78  Temp: 98 F (36.7 C)  Resp: 20    Filed Weights   12/03/13 0939  Weight: 257 lb 6.4 oz (116.756 kg)    GENERAL:alert, no distress and comfortable. She is morbidly obese SKIN: skin color, texture, turgor are normal, no rashes or significant lesions EYES: normal, conjunctiva are pink and non-injected, sclera clear OROPHARYNX:no exudate, normal lips, buccal mucosa, and tongue  NECK: supple, thyroid normal size, non-tender, without nodularity LYMPH:  no palpable lymphadenopathy in the cervical, axillary or inguinal LUNGS: clear to auscultation and percussion with normal breathing effort HEART: regular rate & rhythm and no murmurs with severe bilateral lower extremity edema ABDOMEN:abdomen soft, non-tender and normal bowel sounds Musculoskeletal:no cyanosis of digits and no clubbing  PSYCH: alert & oriented x 3 with fluent speech NEURO: no focal motor/sensory deficits Bilateral breasts examination were performed. Well-healed lumpectomy scar in the left breast with no other palpable abnormalities. LABORATORY DATA:  I have reviewed the data as listed Lab Results  Component Value Date   WBC 7.4 12/03/2013   HGB 12.6 12/03/2013   HCT 38.0 12/03/2013   MCV 97.8 12/03/2013   PLT 168 12/03/2013    Recent Labs  04/04/13 0809 12/03/13 0927  NA 143 142  K 3.9 4.4  CO2 24 28  GLUCOSE 107 129  BUN 14.0 17.1  CREATININE 0.8 0.8  CALCIUM 9.1 9.1  PROT 7.0 6.7  ALBUMIN 3.4* 3.2*  AST 15 15  ALT 10 15  ALKPHOS 101 96  BILITOT 0.77 0.66    ASSESSMENT & PLAN:  Breast cancer She is now almost 5 years out from her original diagnosis. Recent mammogram was negative. Examination is benign. I will discharge her from the clinic and recommend she follows closely with her primary care provider.  Neuropathy This is a residual side effects from prior chemotherapy. Recommend observation only.  Bilateral leg edema This is severe. I am concerned about possible congestive heart failure due to prior exposure to chemotherapy. I  recommend cardiology evaluation. She declined referral here but she will contact her primary care provider to ask for referral.     All questions were answered. The patient knows to call the clinic with any problems, questions or concerns. I spent 25 minutes counseling the patient face to face. The total time spent in the appointment was 40 minutes and more than 50% was on counseling.     [facility], Della Homan, MD 12/03/2013 9:08 PM

## 2013-12-03 NOTE — Assessment & Plan Note (Signed)
This is severe. I am concerned about possible congestive heart failure due to prior exposure to chemotherapy. I recommend cardiology evaluation. She declined referral here but she will contact her primary care provider to ask for referral.

## 2013-12-03 NOTE — Assessment & Plan Note (Signed)
This is a residual side effects from prior chemotherapy. Recommend observation only.

## 2019-07-09 ENCOUNTER — Other Ambulatory Visit: Payer: Self-pay

## 2019-07-09 ENCOUNTER — Ambulatory Visit (INDEPENDENT_AMBULATORY_CARE_PROVIDER_SITE_OTHER): Payer: PRIVATE HEALTH INSURANCE | Admitting: Cardiology

## 2019-07-09 VITALS — BP 128/70 | HR 87 | Temp 95.5°F | Ht 65.0 in | Wt 230.0 lb

## 2019-07-09 DIAGNOSIS — I10 Essential (primary) hypertension: Secondary | ICD-10-CM | POA: Diagnosis not present

## 2019-07-09 DIAGNOSIS — R072 Precordial pain: Secondary | ICD-10-CM | POA: Diagnosis not present

## 2019-07-09 DIAGNOSIS — I517 Cardiomegaly: Secondary | ICD-10-CM

## 2019-07-09 NOTE — Patient Instructions (Signed)
Medication Instructions:  Continue same medications *If you need a refill on your cardiac medications before your next appointment, please call your pharmacy*  Lab Work: None ordered  Testing/Procedures: Schedule Echo  Follow-Up: At [facility], you and your health needs are our priority.  As part of our continuing mission to provide you with exceptional heart care, we have created designated Provider Care Teams.  These Care Teams include your primary Cardiologist (physician) and Advanced Practice Providers (APPs -  Physician Assistants and Nurse Practitioners) who all work together to provide you with the care you need, when you need it.  Your next appointment:  1 month  The format for your next appointment:  Office   Provider:  Dr.Schumann

## 2019-07-09 NOTE — Progress Notes (Signed)
Cardiology Office Note:    Date:  07/10/2019   ID:  Loma Messing, DOB 1941-03-30, MRN [id number]  PCP:  Maury Dus, MD  Cardiologist:  No primary care provider on file.  Electrophysiologist:  None   Referring MD: Maury Dus, MD   Chief Complaint  Patient presents with  . New Patient (Initial Visit)  . Edema    History of Present Illness:    Lindsey Brady is a 79 y.o. female with a hx of breast cancer s/p surgery and chemoradiation, hypertension, hyperlipidemia, prediabetes who is referred by Dr. Mariea Clonts for an evaluation of chest pain.  She had an episode of chest tightness in December.  Occurred in center of her chest.  Lasted for about an hour.  Occurred at rest.  Happened when she was at work, she denies being stressed prior to episode.  Reports 5 out of 10 pain.  No other chest pain since.  States that she goes for walks 10 to 15 minutes 3-4 times per week.  No chest pain with exertion.  Denies any palpitations, lower extremity edema.  Reports BP has been in the 120-130/70-80s.  Never smoked.  Father had CHF, MI first at age 32s.     Past Medical History:  Diagnosis Date  . Alkaline phosphatase elevation   . Breast cancer 2010   Tripple negative  . Hypertension   . Nephrolithiasis   . Skin rash     Past Surgical History:  Procedure Laterality Date  . TONSILECTOMY, ADENOIDECTOMY, BILATERAL MYRINGOTOMY AND TUBES      Current Medications: Current Meds  Medication Sig  . carvedilol (COREG) 6.25 MG tablet Take 6.25 mg by mouth 2 (two) times daily with a meal.  . furosemide (LASIX) 20 MG tablet Take 20 mg by mouth daily as needed (swelling).   . hydrochlorothiazide (HYDRODIURIL) 25 MG tablet Take 25 mg by mouth every morning.  Marland Kitchen LOSARTAN POTASSIUM PO Take 100 mg by mouth daily.  . potassium chloride SA (K-DUR,KLOR-CON) 20 MEQ tablet Take 20 mEq by mouth daily as needed. W/ lasix  . [DISCONTINUED] amLODipine (NORVASC) 5 MG tablet Take 5 mg by mouth daily.  . [DISCONTINUED]  carvedilol (COREG) 3.125 MG tablet Take 3.125 mg by mouth 2 (two) times daily with a meal.  . [DISCONTINUED] olmesartan (BENICAR) 20 MG tablet Take 20 mg by mouth daily.     Allergies:   Ace inhibitors   Social History   Socioeconomic History  . Marital status: Widowed    Spouse name: Not on file  . Number of children: Not on file  . Years of education: Not on file  . Highest education level: Not on file  Occupational History  . Not on file  Tobacco Use  . Smoking status: Never Smoker  . Smokeless tobacco: Never Used  Substance and Sexual Activity  . Alcohol use: No  . Drug use: No  . Sexual activity: Not on file  Other Topics Concern  . Not on file  Social History Narrative  . Not on file   Social Determinants of Health   Financial Resource Strain:   . Difficulty of Paying Living Expenses: Not on file  Food Insecurity:   . Worried About Charity fundraiser in the Last Year: Not on file  . Ran Out of Food in the Last Year: Not on file  Transportation Needs:   . Lack of Transportation (Medical): Not on file  . Lack of Transportation (Non-Medical): Not on file  Physical Activity:   .  Days of Exercise per Week: Not on file  . Minutes of Exercise per Session: Not on file  Stress:   . Feeling of Stress : Not on file  Social Connections:   . Frequency of Communication with Friends and Family: Not on file  . Frequency of Social Gatherings with Friends and Family: Not on file  . Attends Religious Services: Not on file  . Active Member of Clubs or Organizations: Not on file  . Attends Archivist Meetings: Not on file  . Marital Status: Not on file     Family History: The patient's family history includes Cancer in her maternal aunt, paternal aunt, and paternal grandfather.  ROS:   Please see the history of present illness.     All other systems reviewed and are negative.  EKGs/Labs/Other Studies Reviewed:    The following studies were reviewed  today:   EKG:  EKG is ordered today.  The ekg ordered today demonstrates sinus rhythm, rate 87, LVH with repolarization  Recent Labs: No results found for requested labs within last 8760 hours.  Recent Lipid Panel    Component Value Date/Time   CHOL  11/20/2008 0400    151        ATP III CLASSIFICATION:  <200     mg/dL   Desirable  200-239  mg/dL   Borderline High  >=240    mg/dL   High          TRIG 113 11/20/2008 0400   HDL 36 (L) 11/20/2008 0400   CHOLHDL 4.2 11/20/2008 0400   VLDL 23 11/20/2008 0400   LDLCALC  11/20/2008 0400    92        Total Cholesterol/HDL:CHD Risk Coronary Heart Disease Risk Table                     Men   Women  1/2 Average Risk   3.4   3.3  Average Risk       5.0   4.4  2 X Average Risk   9.6   7.1  3 X Average Risk  23.4   11.0        Use the calculated Patient Ratio above and the CHD Risk Table to determine the patient's CHD Risk.        ATP III CLASSIFICATION (LDL):  <100     mg/dL   Optimal  100-129  mg/dL   Near or Above                    Optimal  130-159  mg/dL   Borderline  160-189  mg/dL   High  >190     mg/dL   Very High    Physical Exam:    VS:  BP 128/70 (BP Location: Left Arm, Patient Position: Sitting, Cuff Size: Large)   Pulse 87   Temp (!) 95.5 F (35.3 C)   Ht 5\' 5"  (1.651 m)   Wt 230 lb (104.3 kg)   BMI 38.27 kg/m     Wt Readings from Last 3 Encounters:  07/09/19 230 lb (104.3 kg)  12/03/13 257 lb 6.4 oz (116.8 kg)  04/04/13 235 lb (106.6 kg)     GEN: Well nourished, well developed in no acute distress HEENT: Normal NECK: No JVD LYMPHATICS: No lymphadenopathy CARDIAC: RRR, no murmurs RESPIRATORY:  Clear to auscultation without rales, wheezing or rhonchi  ABDOMEN: Soft, non-tender, non-distended MUSCULOSKELETAL:  No edema; No deformity  SKIN: Warm and  dry NEUROLOGIC:  Alert and oriented x 3 PSYCHIATRIC:  Normal affect   ASSESSMENT:    1. Precordial pain   2. LVH (left ventricular hypertrophy)    3. Essential hypertension    PLAN:    In order of problems listed above:  Chest pain: Atypical in description, as describes single episode of resting pain that lasted about 1 hour and resolved.  No exertional chest pain.  She does have a history of radiation for breast cancer, will check TTE.  Will consider ischemia evaluation if having further chest pain.  LVH: On EKG.  Likely secondary to hypertension, will check TTE as above  Hypertension: Appears controlled.  Continue hydrochlorothiazide 25 mg daily, carvedilol 3.125 mg twice daily, and losartan 100 mg daily  Prediabetes: A1c 6.1  RTC in 1 month  Medication Adjustments/Labs and Tests Ordered: Current medicines are reviewed at length with the patient today.  Concerns regarding medicines are outlined above.  Orders Placed This Encounter  Procedures  . EKG 12-Lead  . ECHOCARDIOGRAM COMPLETE   No orders of the defined types were placed in this encounter.   Patient Instructions  Medication Instructions:  Continue same medications *If you need a refill on your cardiac medications before your next appointment, please call your pharmacy*  Lab Work: None ordered  Testing/Procedures: Schedule Echo  Follow-Up: At [facility], you and your health needs are our priority.  As part of our continuing mission to provide you with exceptional heart care, we have created designated Provider Care Teams.  These Care Teams include your primary Cardiologist (physician) and Advanced Practice Providers (APPs -  Physician Assistants and Nurse Practitioners) who all work together to provide you with the care you need, when you need it.  Your next appointment:  1 month  The format for your next appointment:  Office   Provider:  Dr.Brecken Dewoody     Signed, Donato Heinz, MD  07/10/2019 10:59 AM    [place]

## 2019-07-22 ENCOUNTER — Ambulatory Visit (HOSPITAL_COMMUNITY): Payer: PRIVATE HEALTH INSURANCE | Attending: Cardiovascular Disease

## 2019-07-22 ENCOUNTER — Other Ambulatory Visit: Payer: Self-pay

## 2019-07-22 DIAGNOSIS — R072 Precordial pain: Secondary | ICD-10-CM | POA: Diagnosis not present

## 2019-08-12 ENCOUNTER — Ambulatory Visit (INDEPENDENT_AMBULATORY_CARE_PROVIDER_SITE_OTHER): Payer: PRIVATE HEALTH INSURANCE | Admitting: Cardiology

## 2019-08-12 ENCOUNTER — Encounter: Payer: Self-pay | Admitting: Cardiology

## 2019-08-12 ENCOUNTER — Other Ambulatory Visit: Payer: Self-pay

## 2019-08-12 VITALS — BP 132/82 | HR 81 | Ht 65.0 in | Wt 227.0 lb

## 2019-08-12 DIAGNOSIS — I10 Essential (primary) hypertension: Secondary | ICD-10-CM

## 2019-08-12 DIAGNOSIS — R072 Precordial pain: Secondary | ICD-10-CM

## 2019-08-12 DIAGNOSIS — I517 Cardiomegaly: Secondary | ICD-10-CM

## 2019-08-12 NOTE — Progress Notes (Signed)
Cardiology Office Note:    Date:  08/12/2019   ID:  Lindsey Brady, DOB December 28, 1940, MRN [id number] Chest pain PCP:  Maury Dus, MD  Cardiologist:  No primary care provider on file.  Electrophysiologist:  None   Referring MD: Maury Dus, MD   Chief Complaint  Patient presents with  . Chest Pain    History of Present Illness:    Lindsey Brady is a 79 y.o. female with a hx of breast cancer s/p surgery and chemoradiation, hypertension, hyperlipidemia, prediabetes who presents for follow-up of chest pain.  She had an episode of chest tightness in December.  Occurred in center of her chest.  Lasted for about an hour.  Occurred at rest.  Happened when she was at work, she denies being stressed prior to episode.  Reports 5 out of 10 pain.  No other chest pain since.  States that she goes for walks 10 to 15 minutes 3-4 times per week.  No chest pain with exertion.  Denies any palpitations, lower extremity edema.  Reports BP has been in the 120-130/70-80s.  Never smoked.  Father had CHF, MI first at age 78s.    TTE on 07/22/2019 showed normal LV systolic function, mild LVH, normal RV function, no significant valvular disease.  Since last clinic visit, she denies any further chest pain.  Denies any shortness of breath, lightheadedness, syncope, or palpitations.  Does report she gets occasional ankle swelling.  Takes Lasix as needed, usually only in the summer.  Reports BP has been well controlled, usually 130s over 80s.  She walks for about 15 minutes/day.   Past Medical History:  Diagnosis Date  . Alkaline phosphatase elevation   . Breast cancer ([place]) 2010   Tripple negative  . Hypertension   . Nephrolithiasis   . Skin rash     Past Surgical History:  Procedure Laterality Date  . TONSILECTOMY, ADENOIDECTOMY, BILATERAL MYRINGOTOMY AND TUBES      Current Medications: Current Meds  Medication Sig  . carvedilol (COREG) 6.25 MG tablet Take 6.25 mg by mouth 2 (two) times daily with a meal.    . furosemide (LASIX) 20 MG tablet Take 20 mg by mouth daily as needed (swelling).   . hydrochlorothiazide (HYDRODIURIL) 25 MG tablet Take 25 mg by mouth every morning.  Marland Kitchen LOSARTAN POTASSIUM PO Take 100 mg by mouth daily.  . potassium chloride SA (K-DUR,KLOR-CON) 20 MEQ tablet Take 20 mEq by mouth daily as needed. W/ lasix     Allergies:   Ace inhibitors   Social History   Socioeconomic History  . Marital status: Widowed    Spouse name: Not on file  . Number of children: Not on file  . Years of education: Not on file  . Highest education level: Not on file  Occupational History  . Not on file  Tobacco Use  . Smoking status: Never Smoker  . Smokeless tobacco: Never Used  Substance and Sexual Activity  . Alcohol use: No  . Drug use: No  . Sexual activity: Not on file  Other Topics Concern  . Not on file  Social History Narrative  . Not on file   Social Determinants of Health   Financial Resource Strain:   . Difficulty of Paying Living Expenses: Not on file  Food Insecurity:   . Worried About Charity fundraiser in the Last Year: Not on file  . Ran Out of Food in the Last Year: Not on file  Transportation Needs:   .  Lack of Transportation (Medical): Not on file  . Lack of Transportation (Non-Medical): Not on file  Physical Activity:   . Days of Exercise per Week: Not on file  . Minutes of Exercise per Session: Not on file  Stress:   . Feeling of Stress : Not on file  Social Connections:   . Frequency of Communication with Friends and Family: Not on file  . Frequency of Social Gatherings with Friends and Family: Not on file  . Attends Religious Services: Not on file  . Active Member of Clubs or Organizations: Not on file  . Attends Archivist Meetings: Not on file  . Marital Status: Not on file     Family History: The patient's family history includes Cancer in her maternal aunt, paternal aunt, and paternal grandfather.  ROS:   Please see the history  of present illness.     All other systems reviewed and are negative.  EKGs/Labs/Other Studies Reviewed:    The following studies were reviewed today:   EKG:  EKG is not ordered today.  The last ekg ordered today demonstrates sinus rhythm, rate 87, LVH with repolarization  Recent Labs: No results found for requested labs within last 8760 hours.  Recent Lipid Panel    Component Value Date/Time   CHOL  11/20/2008 0400    151        ATP III CLASSIFICATION:  <200     mg/dL   Desirable  200-239  mg/dL   Borderline High  >=240    mg/dL   High          TRIG 113 11/20/2008 0400   HDL 36 (L) 11/20/2008 0400   CHOLHDL 4.2 11/20/2008 0400   VLDL 23 11/20/2008 0400   LDLCALC  11/20/2008 0400    92        Total Cholesterol/HDL:CHD Risk Coronary Heart Disease Risk Table                     Men   Women  1/2 Average Risk   3.4   3.3  Average Risk       5.0   4.4  2 X Average Risk   9.6   7.1  3 X Average Risk  23.4   11.0        Use the calculated Patient Ratio above and the CHD Risk Table to determine the patient's CHD Risk.        ATP III CLASSIFICATION (LDL):  <100     mg/dL   Optimal  100-129  mg/dL   Near or Above                    Optimal  130-159  mg/dL   Borderline  160-189  mg/dL   High  >190     mg/dL   Very High  TTE 07/22/19: 1. Left ventricular ejection fraction, by visual estimation, is 60 to  65%. The left ventricle has normal function. There is mildly increased  left ventricular hypertrophy.  2. The left ventricle has no regional wall motion abnormalities.  3. Global right ventricle has normal systolic function.The right  ventricular size is normal. No increase in right ventricular wall  thickness.  4. Left atrial size was moderately dilated.  5. Right atrial size was normal.  6. The mitral valve is normal in structure. No evidence of mitral valve  regurgitation. No evidence of mitral stenosis.  7. The tricuspid valve is normal in structure.  8. The  tricuspid valve is normal in structure. Tricuspid valve  regurgitation is not demonstrated.  9. The aortic valve is tricuspid. Aortic valve regurgitation is not  visualized. Mild aortic valve sclerosis without stenosis.  10. The pulmonic valve was normal in structure. Pulmonic valve  regurgitation is not visualized.  11. The inferior vena cava is normal in size with greater than 50%  respiratory variability, suggesting right atrial pressure of 3 mmHg.   Physical Exam:    VS:  BP 132/82   Pulse 81   Ht 5\' 5"  (1.651 m)   Wt 227 lb (103 kg)   SpO2 98%   BMI 37.77 kg/m     Wt Readings from Last 3 Encounters:  08/12/19 227 lb (103 kg)  07/09/19 230 lb (104.3 kg)  12/03/13 257 lb 6.4 oz (116.8 kg)     GEN: Well nourished, well developed in no acute distress HEENT: Normal NECK: No JVD LYMPHATICS: No lymphadenopathy CARDIAC: RRR, no murmurs RESPIRATORY:  Clear to auscultation without rales, wheezing or rhonchi  ABDOMEN: Soft, non-tender, non-distended MUSCULOSKELETAL:  Trace edema; No deformity  SKIN: Warm and dry NEUROLOGIC:  Alert and oriented x 3 PSYCHIATRIC:  Normal affect   ASSESSMENT:    1. Precordial pain   2. LVH (left ventricular hypertrophy)   3. Essential hypertension    PLAN:    In order of problems listed above:  Chest pain: Atypical in description, as describes single episode of resting pain that lasted about 1 hour and resolved.  No exertional chest pain.  She does have a history of radiation for breast cancer, TTE shows no significant abnormalities.  Will consider ischemia evaluation if having further chest pain.  LVH: mild on TTE. Likely secondary to hypertension  Hypertension: Appears controlled.  Continue hydrochlorothiazide 25 mg daily, carvedilol 6.25 mg twice daily, and losartan 100 mg daily  Prediabetes: A1c 6.1  RTC in 1 year  Medication Adjustments/Labs and Tests Ordered: Current medicines are reviewed at length with the patient today.   Concerns regarding medicines are outlined above.  No orders of the defined types were placed in this encounter.  No orders of the defined types were placed in this encounter.   Patient Instructions  Medication Instructions:  Your physician recommends that you continue on your current medications as directed. Please refer to the Current Medication list given to you today.  *If you need a refill on your cardiac medications before your next appointment, please call your pharmacy*  Lab Work: NONE  Testing/Procedures: NONE  Follow-Up: At [facility], you and your health needs are our priority.  As part of our continuing mission to provide you with exceptional heart care, we have created designated Provider Care Teams.  These Care Teams include your primary Cardiologist (physician) and Advanced Practice Providers (APPs -  Physician Assistants and Nurse Practitioners) who all work together to provide you with the care you need, when you need it.  Your next appointment:   12 month(s)  The format for your next appointment:   In Person  Provider:   Oswaldo Milian, MD       Signed, Donato Heinz, MD  08/12/2019 4:01 PM    [place]

## 2019-08-12 NOTE — Patient Instructions (Signed)
Medication Instructions:  Your physician recommends that you continue on your current medications as directed. Please refer to the Current Medication list given to you today.  *If you need a refill on your cardiac medications before your next appointment, please call your pharmacy*  Lab Work: NONE  Testing/Procedures: NONE  Follow-Up: At [facility], you and your health needs are our priority.  As part of our continuing mission to provide you with exceptional heart care, we have created designated Provider Care Teams.  These Care Teams include your primary Cardiologist (physician) and Advanced Practice Providers (APPs -  Physician Assistants and Nurse Practitioners) who all work together to provide you with the care you need, when you need it.  Your next appointment:   12 month(s)  The format for your next appointment:   In Person  Provider:   Christopher Schumann, MD   

## 2020-06-29 ENCOUNTER — Telehealth: Payer: Self-pay | Admitting: *Deleted

## 2020-06-29 NOTE — Telephone Encounter (Signed)
A message was left, re: her follow up visit. 

## 2020-10-06 DIAGNOSIS — E7800 Pure hypercholesterolemia, unspecified: Secondary | ICD-10-CM | POA: Diagnosis not present

## 2020-10-06 DIAGNOSIS — L309 Dermatitis, unspecified: Secondary | ICD-10-CM | POA: Diagnosis not present

## 2020-10-06 DIAGNOSIS — L719 Rosacea, unspecified: Secondary | ICD-10-CM | POA: Diagnosis not present

## 2020-10-06 DIAGNOSIS — I10 Essential (primary) hypertension: Secondary | ICD-10-CM | POA: Diagnosis not present

## 2020-10-06 DIAGNOSIS — G620 Drug-induced polyneuropathy: Secondary | ICD-10-CM | POA: Diagnosis not present

## 2020-10-06 DIAGNOSIS — R7303 Prediabetes: Secondary | ICD-10-CM | POA: Diagnosis not present

## 2020-10-06 DIAGNOSIS — M67449 Ganglion, unspecified hand: Secondary | ICD-10-CM | POA: Diagnosis not present

## 2021-01-05 DIAGNOSIS — Z1231 Encounter for screening mammogram for malignant neoplasm of breast: Secondary | ICD-10-CM | POA: Diagnosis not present

## 2021-04-20 DIAGNOSIS — R7303 Prediabetes: Secondary | ICD-10-CM | POA: Diagnosis not present

## 2021-04-20 DIAGNOSIS — G620 Drug-induced polyneuropathy: Secondary | ICD-10-CM | POA: Diagnosis not present

## 2021-04-20 DIAGNOSIS — L309 Dermatitis, unspecified: Secondary | ICD-10-CM | POA: Diagnosis not present

## 2021-04-20 DIAGNOSIS — L719 Rosacea, unspecified: Secondary | ICD-10-CM | POA: Diagnosis not present

## 2021-04-20 DIAGNOSIS — R609 Edema, unspecified: Secondary | ICD-10-CM | POA: Diagnosis not present

## 2021-04-20 DIAGNOSIS — D692 Other nonthrombocytopenic purpura: Secondary | ICD-10-CM | POA: Diagnosis not present

## 2021-04-20 DIAGNOSIS — I10 Essential (primary) hypertension: Secondary | ICD-10-CM | POA: Diagnosis not present

## 2021-04-20 DIAGNOSIS — Z0000 Encounter for general adult medical examination without abnormal findings: Secondary | ICD-10-CM | POA: Diagnosis not present

## 2021-04-20 DIAGNOSIS — Z1389 Encounter for screening for other disorder: Secondary | ICD-10-CM | POA: Diagnosis not present

## 2021-05-09 DIAGNOSIS — H40009 Preglaucoma, unspecified, unspecified eye: Secondary | ICD-10-CM | POA: Diagnosis not present

## 2022-01-13 DIAGNOSIS — N6320 Unspecified lump in the left breast, unspecified quadrant: Secondary | ICD-10-CM | POA: Diagnosis not present

## 2022-01-13 DIAGNOSIS — Z853 Personal history of malignant neoplasm of breast: Secondary | ICD-10-CM | POA: Diagnosis not present

## 2022-01-13 DIAGNOSIS — R928 Other abnormal and inconclusive findings on diagnostic imaging of breast: Secondary | ICD-10-CM | POA: Diagnosis not present

## 2022-05-16 DIAGNOSIS — R609 Edema, unspecified: Secondary | ICD-10-CM | POA: Diagnosis not present

## 2022-05-16 DIAGNOSIS — I10 Essential (primary) hypertension: Secondary | ICD-10-CM | POA: Diagnosis not present

## 2022-05-16 DIAGNOSIS — R7303 Prediabetes: Secondary | ICD-10-CM | POA: Diagnosis not present

## 2022-05-16 DIAGNOSIS — D692 Other nonthrombocytopenic purpura: Secondary | ICD-10-CM | POA: Diagnosis not present

## 2022-05-16 DIAGNOSIS — G620 Drug-induced polyneuropathy: Secondary | ICD-10-CM | POA: Diagnosis not present

## 2022-05-16 DIAGNOSIS — L719 Rosacea, unspecified: Secondary | ICD-10-CM | POA: Diagnosis not present

## 2022-05-16 DIAGNOSIS — L309 Dermatitis, unspecified: Secondary | ICD-10-CM | POA: Diagnosis not present

## 2022-05-16 DIAGNOSIS — Z0000 Encounter for general adult medical examination without abnormal findings: Secondary | ICD-10-CM | POA: Diagnosis not present

## 2022-06-10 DIAGNOSIS — Z03818 Encounter for observation for suspected exposure to other biological agents ruled out: Secondary | ICD-10-CM | POA: Diagnosis not present

## 2022-06-10 DIAGNOSIS — R051 Acute cough: Secondary | ICD-10-CM | POA: Diagnosis not present

## 2022-10-06 DIAGNOSIS — H401121 Primary open-angle glaucoma, left eye, mild stage: Secondary | ICD-10-CM | POA: Diagnosis not present

## 2022-11-14 DIAGNOSIS — M25569 Pain in unspecified knee: Secondary | ICD-10-CM | POA: Diagnosis not present

## 2022-11-14 DIAGNOSIS — E7800 Pure hypercholesterolemia, unspecified: Secondary | ICD-10-CM | POA: Diagnosis not present

## 2022-11-14 DIAGNOSIS — I10 Essential (primary) hypertension: Secondary | ICD-10-CM | POA: Diagnosis not present

## 2022-11-14 DIAGNOSIS — G629 Polyneuropathy, unspecified: Secondary | ICD-10-CM | POA: Diagnosis not present

## 2022-11-27 DIAGNOSIS — R7301 Impaired fasting glucose: Secondary | ICD-10-CM | POA: Diagnosis not present

## 2023-01-30 DIAGNOSIS — H401124 Primary open-angle glaucoma, left eye, indeterminate stage: Secondary | ICD-10-CM | POA: Diagnosis not present

## 2023-01-30 DIAGNOSIS — H26491 Other secondary cataract, right eye: Secondary | ICD-10-CM | POA: Diagnosis not present

## 2023-01-30 DIAGNOSIS — Z961 Presence of intraocular lens: Secondary | ICD-10-CM | POA: Diagnosis not present

## 2023-02-05 DIAGNOSIS — Z853 Personal history of malignant neoplasm of breast: Secondary | ICD-10-CM | POA: Diagnosis not present

## 2023-02-05 DIAGNOSIS — N6325 Unspecified lump in the left breast, overlapping quadrants: Secondary | ICD-10-CM | POA: Diagnosis not present

## 2023-06-18 DIAGNOSIS — M8588 Other specified disorders of bone density and structure, other site: Secondary | ICD-10-CM | POA: Diagnosis not present

## 2023-06-18 DIAGNOSIS — H409 Unspecified glaucoma: Secondary | ICD-10-CM | POA: Diagnosis not present

## 2023-06-18 DIAGNOSIS — I10 Essential (primary) hypertension: Secondary | ICD-10-CM | POA: Diagnosis not present

## 2023-06-18 DIAGNOSIS — R609 Edema, unspecified: Secondary | ICD-10-CM | POA: Diagnosis not present

## 2023-06-18 DIAGNOSIS — Z23 Encounter for immunization: Secondary | ICD-10-CM | POA: Diagnosis not present

## 2023-06-18 DIAGNOSIS — R7303 Prediabetes: Secondary | ICD-10-CM | POA: Diagnosis not present

## 2023-06-18 DIAGNOSIS — E7800 Pure hypercholesterolemia, unspecified: Secondary | ICD-10-CM | POA: Diagnosis not present

## 2023-06-18 DIAGNOSIS — L821 Other seborrheic keratosis: Secondary | ICD-10-CM | POA: Diagnosis not present

## 2023-06-18 DIAGNOSIS — E559 Vitamin D deficiency, unspecified: Secondary | ICD-10-CM | POA: Diagnosis not present

## 2023-06-18 DIAGNOSIS — Z0000 Encounter for general adult medical examination without abnormal findings: Secondary | ICD-10-CM | POA: Diagnosis not present

## 2023-09-19 DIAGNOSIS — L820 Inflamed seborrheic keratosis: Secondary | ICD-10-CM | POA: Diagnosis not present

## 2023-12-18 DIAGNOSIS — R7303 Prediabetes: Secondary | ICD-10-CM | POA: Diagnosis not present

## 2023-12-18 DIAGNOSIS — R609 Edema, unspecified: Secondary | ICD-10-CM | POA: Diagnosis not present

## 2023-12-18 DIAGNOSIS — I10 Essential (primary) hypertension: Secondary | ICD-10-CM | POA: Diagnosis not present

## 2023-12-18 DIAGNOSIS — E7800 Pure hypercholesterolemia, unspecified: Secondary | ICD-10-CM | POA: Diagnosis not present

## 2024-01-28 DIAGNOSIS — H4052X4 Glaucoma secondary to other eye disorders, left eye, indeterminate stage: Secondary | ICD-10-CM | POA: Diagnosis not present

## 2024-03-25 DIAGNOSIS — Z1231 Encounter for screening mammogram for malignant neoplasm of breast: Secondary | ICD-10-CM | POA: Diagnosis not present
